# Patient Record
Sex: Female | Born: 1945 | Race: White | Hispanic: No | Marital: Married | Smoking: Former smoker
Health system: Southern US, Community
[De-identification: ages and names within clinical notes are randomized; demographics above are authoritative.]

## PROBLEM LIST (undated history)

## (undated) DIAGNOSIS — M810 Age-related osteoporosis without current pathological fracture: Secondary | ICD-10-CM

## (undated) DIAGNOSIS — I252 Old myocardial infarction: Secondary | ICD-10-CM

## (undated) DIAGNOSIS — I5030 Unspecified diastolic (congestive) heart failure: Secondary | ICD-10-CM

## (undated) DIAGNOSIS — I2510 Atherosclerotic heart disease of native coronary artery without angina pectoris: Secondary | ICD-10-CM

## (undated) DIAGNOSIS — E119 Type 2 diabetes mellitus without complications: Secondary | ICD-10-CM

## (undated) DIAGNOSIS — J449 Chronic obstructive pulmonary disease, unspecified: Secondary | ICD-10-CM

## (undated) DIAGNOSIS — E785 Hyperlipidemia, unspecified: Secondary | ICD-10-CM

## (undated) DIAGNOSIS — I10 Essential (primary) hypertension: Secondary | ICD-10-CM

## (undated) DIAGNOSIS — E870 Hyperosmolality and hypernatremia: Secondary | ICD-10-CM

## (undated) HISTORY — DX: Chronic obstructive pulmonary disease, unspecified: J44.9

## (undated) HISTORY — DX: Atherosclerotic heart disease of native coronary artery without angina pectoris: I25.10

## (undated) HISTORY — DX: Age-related osteoporosis without current pathological fracture: M81.0

## (undated) HISTORY — DX: Hyperlipidemia, unspecified: E78.5

## (undated) HISTORY — DX: Type 2 diabetes mellitus without complications: E11.9

## (undated) HISTORY — PX: TUBAL LIGATION: SHX77

## (undated) HISTORY — DX: Essential (primary) hypertension: I10

## (undated) HISTORY — PX: OTHER SURGICAL HISTORY: SHX169

## (undated) HISTORY — DX: Unspecified diastolic (congestive) heart failure: I50.30

## (undated) HISTORY — DX: Hyperosmolality and hypernatremia: E87.0

## (undated) HISTORY — PX: CATARACT EXTRACTION: SUR2

## (undated) HISTORY — DX: Old myocardial infarction: I25.2

---

## 2011-06-02 HISTORY — PX: CORONARY STENT PLACEMENT: SHX1402

## 2012-08-01 DIAGNOSIS — I10 Essential (primary) hypertension: Secondary | ICD-10-CM | POA: Diagnosis not present

## 2012-08-01 DIAGNOSIS — J441 Chronic obstructive pulmonary disease with (acute) exacerbation: Secondary | ICD-10-CM | POA: Diagnosis not present

## 2012-08-01 DIAGNOSIS — E1159 Type 2 diabetes mellitus with other circulatory complications: Secondary | ICD-10-CM | POA: Diagnosis not present

## 2012-08-01 DIAGNOSIS — Z9981 Dependence on supplemental oxygen: Secondary | ICD-10-CM | POA: Diagnosis not present

## 2012-08-01 DIAGNOSIS — F17200 Nicotine dependence, unspecified, uncomplicated: Secondary | ICD-10-CM | POA: Diagnosis not present

## 2012-08-04 DIAGNOSIS — I10 Essential (primary) hypertension: Secondary | ICD-10-CM | POA: Diagnosis not present

## 2012-08-04 DIAGNOSIS — Z9981 Dependence on supplemental oxygen: Secondary | ICD-10-CM | POA: Diagnosis not present

## 2012-08-04 DIAGNOSIS — F17200 Nicotine dependence, unspecified, uncomplicated: Secondary | ICD-10-CM | POA: Diagnosis not present

## 2012-08-04 DIAGNOSIS — J441 Chronic obstructive pulmonary disease with (acute) exacerbation: Secondary | ICD-10-CM | POA: Diagnosis not present

## 2012-08-04 DIAGNOSIS — E1159 Type 2 diabetes mellitus with other circulatory complications: Secondary | ICD-10-CM | POA: Diagnosis not present

## 2012-08-08 DIAGNOSIS — E782 Mixed hyperlipidemia: Secondary | ICD-10-CM | POA: Diagnosis not present

## 2012-08-08 DIAGNOSIS — E1159 Type 2 diabetes mellitus with other circulatory complications: Secondary | ICD-10-CM | POA: Diagnosis not present

## 2012-08-08 DIAGNOSIS — F17200 Nicotine dependence, unspecified, uncomplicated: Secondary | ICD-10-CM | POA: Diagnosis not present

## 2012-08-08 DIAGNOSIS — I10 Essential (primary) hypertension: Secondary | ICD-10-CM | POA: Diagnosis not present

## 2012-08-10 DIAGNOSIS — E1159 Type 2 diabetes mellitus with other circulatory complications: Secondary | ICD-10-CM | POA: Diagnosis not present

## 2012-08-10 DIAGNOSIS — F17200 Nicotine dependence, unspecified, uncomplicated: Secondary | ICD-10-CM | POA: Diagnosis not present

## 2012-08-10 DIAGNOSIS — I10 Essential (primary) hypertension: Secondary | ICD-10-CM | POA: Diagnosis not present

## 2012-08-10 DIAGNOSIS — Z9981 Dependence on supplemental oxygen: Secondary | ICD-10-CM | POA: Diagnosis not present

## 2012-08-10 DIAGNOSIS — J441 Chronic obstructive pulmonary disease with (acute) exacerbation: Secondary | ICD-10-CM | POA: Diagnosis not present

## 2012-08-12 DIAGNOSIS — E1159 Type 2 diabetes mellitus with other circulatory complications: Secondary | ICD-10-CM | POA: Diagnosis not present

## 2012-08-12 DIAGNOSIS — Z9981 Dependence on supplemental oxygen: Secondary | ICD-10-CM | POA: Diagnosis not present

## 2012-08-12 DIAGNOSIS — J441 Chronic obstructive pulmonary disease with (acute) exacerbation: Secondary | ICD-10-CM | POA: Diagnosis not present

## 2012-08-12 DIAGNOSIS — F17200 Nicotine dependence, unspecified, uncomplicated: Secondary | ICD-10-CM | POA: Diagnosis not present

## 2012-08-12 DIAGNOSIS — I10 Essential (primary) hypertension: Secondary | ICD-10-CM | POA: Diagnosis not present

## 2012-08-18 DIAGNOSIS — Z9981 Dependence on supplemental oxygen: Secondary | ICD-10-CM | POA: Diagnosis not present

## 2012-08-18 DIAGNOSIS — E1159 Type 2 diabetes mellitus with other circulatory complications: Secondary | ICD-10-CM | POA: Diagnosis not present

## 2012-08-18 DIAGNOSIS — I10 Essential (primary) hypertension: Secondary | ICD-10-CM | POA: Diagnosis not present

## 2012-08-18 DIAGNOSIS — J3089 Other allergic rhinitis: Secondary | ICD-10-CM | POA: Diagnosis not present

## 2012-08-18 DIAGNOSIS — J441 Chronic obstructive pulmonary disease with (acute) exacerbation: Secondary | ICD-10-CM | POA: Diagnosis not present

## 2012-08-18 DIAGNOSIS — J309 Allergic rhinitis, unspecified: Secondary | ICD-10-CM | POA: Diagnosis not present

## 2012-08-18 DIAGNOSIS — F17200 Nicotine dependence, unspecified, uncomplicated: Secondary | ICD-10-CM | POA: Diagnosis not present

## 2012-08-18 DIAGNOSIS — J449 Chronic obstructive pulmonary disease, unspecified: Secondary | ICD-10-CM | POA: Diagnosis not present

## 2012-08-23 DIAGNOSIS — J441 Chronic obstructive pulmonary disease with (acute) exacerbation: Secondary | ICD-10-CM | POA: Diagnosis not present

## 2012-08-23 DIAGNOSIS — F17200 Nicotine dependence, unspecified, uncomplicated: Secondary | ICD-10-CM | POA: Diagnosis not present

## 2012-08-23 DIAGNOSIS — I10 Essential (primary) hypertension: Secondary | ICD-10-CM | POA: Diagnosis not present

## 2012-08-23 DIAGNOSIS — Z9981 Dependence on supplemental oxygen: Secondary | ICD-10-CM | POA: Diagnosis not present

## 2012-08-23 DIAGNOSIS — E1159 Type 2 diabetes mellitus with other circulatory complications: Secondary | ICD-10-CM | POA: Diagnosis not present

## 2012-08-30 DIAGNOSIS — Z9981 Dependence on supplemental oxygen: Secondary | ICD-10-CM | POA: Diagnosis not present

## 2012-08-30 DIAGNOSIS — J441 Chronic obstructive pulmonary disease with (acute) exacerbation: Secondary | ICD-10-CM | POA: Diagnosis not present

## 2012-08-30 DIAGNOSIS — E1159 Type 2 diabetes mellitus with other circulatory complications: Secondary | ICD-10-CM | POA: Diagnosis not present

## 2012-08-30 DIAGNOSIS — F17200 Nicotine dependence, unspecified, uncomplicated: Secondary | ICD-10-CM | POA: Diagnosis not present

## 2012-08-30 DIAGNOSIS — I10 Essential (primary) hypertension: Secondary | ICD-10-CM | POA: Diagnosis not present

## 2012-09-02 DIAGNOSIS — H2589 Other age-related cataract: Secondary | ICD-10-CM | POA: Diagnosis not present

## 2012-09-06 DIAGNOSIS — I10 Essential (primary) hypertension: Secondary | ICD-10-CM | POA: Diagnosis not present

## 2012-09-06 DIAGNOSIS — I2510 Atherosclerotic heart disease of native coronary artery without angina pectoris: Secondary | ICD-10-CM | POA: Diagnosis not present

## 2012-09-06 DIAGNOSIS — F17200 Nicotine dependence, unspecified, uncomplicated: Secondary | ICD-10-CM | POA: Diagnosis not present

## 2012-09-06 DIAGNOSIS — E785 Hyperlipidemia, unspecified: Secondary | ICD-10-CM | POA: Diagnosis not present

## 2012-09-06 DIAGNOSIS — E119 Type 2 diabetes mellitus without complications: Secondary | ICD-10-CM | POA: Diagnosis not present

## 2012-09-14 DIAGNOSIS — F17200 Nicotine dependence, unspecified, uncomplicated: Secondary | ICD-10-CM | POA: Diagnosis not present

## 2012-09-14 DIAGNOSIS — E1159 Type 2 diabetes mellitus with other circulatory complications: Secondary | ICD-10-CM | POA: Diagnosis not present

## 2012-09-14 DIAGNOSIS — Z9981 Dependence on supplemental oxygen: Secondary | ICD-10-CM | POA: Diagnosis not present

## 2012-09-14 DIAGNOSIS — I10 Essential (primary) hypertension: Secondary | ICD-10-CM | POA: Diagnosis not present

## 2012-09-14 DIAGNOSIS — J441 Chronic obstructive pulmonary disease with (acute) exacerbation: Secondary | ICD-10-CM | POA: Diagnosis not present

## 2012-09-26 DIAGNOSIS — Z9981 Dependence on supplemental oxygen: Secondary | ICD-10-CM | POA: Diagnosis not present

## 2012-09-26 DIAGNOSIS — E1159 Type 2 diabetes mellitus with other circulatory complications: Secondary | ICD-10-CM | POA: Diagnosis not present

## 2012-09-26 DIAGNOSIS — F17200 Nicotine dependence, unspecified, uncomplicated: Secondary | ICD-10-CM | POA: Diagnosis not present

## 2012-09-26 DIAGNOSIS — I10 Essential (primary) hypertension: Secondary | ICD-10-CM | POA: Diagnosis not present

## 2012-09-26 DIAGNOSIS — J441 Chronic obstructive pulmonary disease with (acute) exacerbation: Secondary | ICD-10-CM | POA: Diagnosis not present

## 2012-09-27 DIAGNOSIS — H269 Unspecified cataract: Secondary | ICD-10-CM | POA: Diagnosis not present

## 2012-09-27 DIAGNOSIS — H2589 Other age-related cataract: Secondary | ICD-10-CM | POA: Diagnosis not present

## 2012-10-17 DIAGNOSIS — J441 Chronic obstructive pulmonary disease with (acute) exacerbation: Secondary | ICD-10-CM | POA: Diagnosis not present

## 2012-10-25 DIAGNOSIS — E119 Type 2 diabetes mellitus without complications: Secondary | ICD-10-CM | POA: Diagnosis not present

## 2012-10-25 DIAGNOSIS — H269 Unspecified cataract: Secondary | ICD-10-CM | POA: Diagnosis not present

## 2012-10-25 DIAGNOSIS — J449 Chronic obstructive pulmonary disease, unspecified: Secondary | ICD-10-CM | POA: Diagnosis not present

## 2012-10-25 DIAGNOSIS — H259 Unspecified age-related cataract: Secondary | ICD-10-CM | POA: Diagnosis not present

## 2012-10-25 DIAGNOSIS — H2589 Other age-related cataract: Secondary | ICD-10-CM | POA: Diagnosis not present

## 2012-11-02 DIAGNOSIS — I5032 Chronic diastolic (congestive) heart failure: Secondary | ICD-10-CM | POA: Diagnosis not present

## 2012-11-02 DIAGNOSIS — J441 Chronic obstructive pulmonary disease with (acute) exacerbation: Secondary | ICD-10-CM | POA: Diagnosis not present

## 2012-11-02 DIAGNOSIS — K219 Gastro-esophageal reflux disease without esophagitis: Secondary | ICD-10-CM | POA: Diagnosis not present

## 2012-11-17 DIAGNOSIS — J449 Chronic obstructive pulmonary disease, unspecified: Secondary | ICD-10-CM | POA: Diagnosis not present

## 2012-11-25 DIAGNOSIS — F17200 Nicotine dependence, unspecified, uncomplicated: Secondary | ICD-10-CM | POA: Diagnosis not present

## 2012-11-25 DIAGNOSIS — J3089 Other allergic rhinitis: Secondary | ICD-10-CM | POA: Diagnosis not present

## 2012-11-25 DIAGNOSIS — Z006 Encounter for examination for normal comparison and control in clinical research program: Secondary | ICD-10-CM | POA: Diagnosis not present

## 2012-11-25 DIAGNOSIS — J4489 Other specified chronic obstructive pulmonary disease: Secondary | ICD-10-CM | POA: Diagnosis not present

## 2012-11-25 DIAGNOSIS — J449 Chronic obstructive pulmonary disease, unspecified: Secondary | ICD-10-CM | POA: Diagnosis not present

## 2012-12-01 DIAGNOSIS — E782 Mixed hyperlipidemia: Secondary | ICD-10-CM | POA: Diagnosis not present

## 2012-12-01 DIAGNOSIS — I10 Essential (primary) hypertension: Secondary | ICD-10-CM | POA: Diagnosis not present

## 2012-12-01 DIAGNOSIS — E1159 Type 2 diabetes mellitus with other circulatory complications: Secondary | ICD-10-CM | POA: Diagnosis not present

## 2012-12-07 DIAGNOSIS — J449 Chronic obstructive pulmonary disease, unspecified: Secondary | ICD-10-CM | POA: Diagnosis not present

## 2012-12-12 DIAGNOSIS — E782 Mixed hyperlipidemia: Secondary | ICD-10-CM | POA: Diagnosis not present

## 2012-12-12 DIAGNOSIS — E1149 Type 2 diabetes mellitus with other diabetic neurological complication: Secondary | ICD-10-CM | POA: Diagnosis not present

## 2012-12-12 DIAGNOSIS — I10 Essential (primary) hypertension: Secondary | ICD-10-CM | POA: Diagnosis not present

## 2012-12-12 DIAGNOSIS — K219 Gastro-esophageal reflux disease without esophagitis: Secondary | ICD-10-CM | POA: Diagnosis not present

## 2012-12-27 DIAGNOSIS — J3089 Other allergic rhinitis: Secondary | ICD-10-CM | POA: Diagnosis not present

## 2012-12-27 DIAGNOSIS — J449 Chronic obstructive pulmonary disease, unspecified: Secondary | ICD-10-CM | POA: Diagnosis not present

## 2013-02-10 DIAGNOSIS — J3089 Other allergic rhinitis: Secondary | ICD-10-CM | POA: Diagnosis not present

## 2013-02-10 DIAGNOSIS — J449 Chronic obstructive pulmonary disease, unspecified: Secondary | ICD-10-CM | POA: Diagnosis not present

## 2013-03-09 DIAGNOSIS — I10 Essential (primary) hypertension: Secondary | ICD-10-CM | POA: Diagnosis not present

## 2013-03-09 DIAGNOSIS — E1159 Type 2 diabetes mellitus with other circulatory complications: Secondary | ICD-10-CM | POA: Diagnosis not present

## 2013-03-09 DIAGNOSIS — E782 Mixed hyperlipidemia: Secondary | ICD-10-CM | POA: Diagnosis not present

## 2013-03-13 DIAGNOSIS — E870 Hyperosmolality and hypernatremia: Secondary | ICD-10-CM | POA: Diagnosis not present

## 2013-03-15 DIAGNOSIS — IMO0002 Reserved for concepts with insufficient information to code with codable children: Secondary | ICD-10-CM | POA: Diagnosis not present

## 2013-03-15 DIAGNOSIS — Z139 Encounter for screening, unspecified: Secondary | ICD-10-CM | POA: Diagnosis not present

## 2013-03-15 DIAGNOSIS — E870 Hyperosmolality and hypernatremia: Secondary | ICD-10-CM | POA: Diagnosis not present

## 2013-03-15 DIAGNOSIS — Z1389 Encounter for screening for other disorder: Secondary | ICD-10-CM | POA: Diagnosis not present

## 2013-03-15 DIAGNOSIS — Z0000 Encounter for general adult medical examination without abnormal findings: Secondary | ICD-10-CM | POA: Diagnosis not present

## 2013-03-15 DIAGNOSIS — Z1331 Encounter for screening for depression: Secondary | ICD-10-CM | POA: Diagnosis not present

## 2013-03-15 DIAGNOSIS — Z23 Encounter for immunization: Secondary | ICD-10-CM | POA: Diagnosis not present

## 2013-03-28 DIAGNOSIS — E119 Type 2 diabetes mellitus without complications: Secondary | ICD-10-CM | POA: Diagnosis not present

## 2013-03-28 DIAGNOSIS — Z79899 Other long term (current) drug therapy: Secondary | ICD-10-CM | POA: Diagnosis not present

## 2013-03-28 DIAGNOSIS — I2510 Atherosclerotic heart disease of native coronary artery without angina pectoris: Secondary | ICD-10-CM | POA: Diagnosis not present

## 2013-03-28 DIAGNOSIS — E1159 Type 2 diabetes mellitus with other circulatory complications: Secondary | ICD-10-CM | POA: Diagnosis not present

## 2013-03-28 DIAGNOSIS — I10 Essential (primary) hypertension: Secondary | ICD-10-CM | POA: Diagnosis not present

## 2013-03-28 DIAGNOSIS — E785 Hyperlipidemia, unspecified: Secondary | ICD-10-CM | POA: Diagnosis not present

## 2013-05-02 DIAGNOSIS — J449 Chronic obstructive pulmonary disease, unspecified: Secondary | ICD-10-CM | POA: Diagnosis not present

## 2013-05-02 DIAGNOSIS — J3089 Other allergic rhinitis: Secondary | ICD-10-CM | POA: Diagnosis not present

## 2013-05-02 LAB — PULMONARY FUNCTION TEST

## 2013-05-24 DIAGNOSIS — R509 Fever, unspecified: Secondary | ICD-10-CM | POA: Diagnosis not present

## 2013-05-24 DIAGNOSIS — IMO0002 Reserved for concepts with insufficient information to code with codable children: Secondary | ICD-10-CM | POA: Diagnosis not present

## 2013-05-24 DIAGNOSIS — J441 Chronic obstructive pulmonary disease with (acute) exacerbation: Secondary | ICD-10-CM | POA: Diagnosis not present

## 2013-06-02 DIAGNOSIS — J441 Chronic obstructive pulmonary disease with (acute) exacerbation: Secondary | ICD-10-CM | POA: Diagnosis not present

## 2013-06-02 DIAGNOSIS — IMO0002 Reserved for concepts with insufficient information to code with codable children: Secondary | ICD-10-CM | POA: Diagnosis not present

## 2013-06-20 DIAGNOSIS — E1159 Type 2 diabetes mellitus with other circulatory complications: Secondary | ICD-10-CM | POA: Diagnosis not present

## 2013-06-20 DIAGNOSIS — I10 Essential (primary) hypertension: Secondary | ICD-10-CM | POA: Diagnosis not present

## 2013-06-20 DIAGNOSIS — E782 Mixed hyperlipidemia: Secondary | ICD-10-CM | POA: Diagnosis not present

## 2013-06-26 DIAGNOSIS — E1159 Type 2 diabetes mellitus with other circulatory complications: Secondary | ICD-10-CM | POA: Diagnosis not present

## 2013-06-26 DIAGNOSIS — J449 Chronic obstructive pulmonary disease, unspecified: Secondary | ICD-10-CM | POA: Diagnosis not present

## 2013-06-26 DIAGNOSIS — IMO0002 Reserved for concepts with insufficient information to code with codable children: Secondary | ICD-10-CM | POA: Diagnosis not present

## 2013-06-26 DIAGNOSIS — I119 Hypertensive heart disease without heart failure: Secondary | ICD-10-CM | POA: Diagnosis not present

## 2013-06-26 DIAGNOSIS — I5032 Chronic diastolic (congestive) heart failure: Secondary | ICD-10-CM | POA: Diagnosis not present

## 2013-06-26 DIAGNOSIS — Z1231 Encounter for screening mammogram for malignant neoplasm of breast: Secondary | ICD-10-CM | POA: Diagnosis not present

## 2013-06-26 DIAGNOSIS — E782 Mixed hyperlipidemia: Secondary | ICD-10-CM | POA: Diagnosis not present

## 2013-06-30 DIAGNOSIS — Z1231 Encounter for screening mammogram for malignant neoplasm of breast: Secondary | ICD-10-CM | POA: Diagnosis not present

## 2013-07-04 DIAGNOSIS — I119 Hypertensive heart disease without heart failure: Secondary | ICD-10-CM | POA: Diagnosis not present

## 2013-07-04 DIAGNOSIS — E782 Mixed hyperlipidemia: Secondary | ICD-10-CM | POA: Diagnosis not present

## 2013-07-04 DIAGNOSIS — Z5181 Encounter for therapeutic drug level monitoring: Secondary | ICD-10-CM | POA: Diagnosis not present

## 2013-07-04 DIAGNOSIS — I5032 Chronic diastolic (congestive) heart failure: Secondary | ICD-10-CM | POA: Diagnosis not present

## 2013-07-06 ENCOUNTER — Telehealth: Payer: Self-pay | Admitting: Critical Care Medicine

## 2013-07-06 NOTE — Telephone Encounter (Signed)
Rec'd fax to schedule a consult appt in [place].  Pt is scheduled to see PW in [place] on 2/24 @ 4:15 PM.  Dr. Yetta FlockHodges' office verbalized understanding & states nothing further needed at this time.  Antionette FairyHolly D Pryor

## 2013-08-28 ENCOUNTER — Encounter: Payer: Self-pay | Admitting: Critical Care Medicine

## 2013-08-28 ENCOUNTER — Telehealth: Payer: Self-pay | Admitting: Critical Care Medicine

## 2013-08-28 NOTE — Telephone Encounter (Signed)
Error -- Needed abstract encounter

## 2013-08-29 ENCOUNTER — Ambulatory Visit (INDEPENDENT_AMBULATORY_CARE_PROVIDER_SITE_OTHER): Payer: Self-pay | Admitting: Critical Care Medicine

## 2013-08-29 ENCOUNTER — Encounter: Payer: Self-pay | Admitting: Critical Care Medicine

## 2013-08-29 VITALS — BP 132/54 | HR 88

## 2013-08-29 DIAGNOSIS — J40 Bronchitis, not specified as acute or chronic: Secondary | ICD-10-CM | POA: Diagnosis not present

## 2013-08-29 DIAGNOSIS — R918 Other nonspecific abnormal finding of lung field: Secondary | ICD-10-CM | POA: Diagnosis not present

## 2013-08-29 DIAGNOSIS — J449 Chronic obstructive pulmonary disease, unspecified: Secondary | ICD-10-CM

## 2013-08-29 DIAGNOSIS — E785 Hyperlipidemia, unspecified: Secondary | ICD-10-CM | POA: Insufficient documentation

## 2013-08-29 DIAGNOSIS — I5030 Unspecified diastolic (congestive) heart failure: Secondary | ICD-10-CM | POA: Insufficient documentation

## 2013-08-29 DIAGNOSIS — I10 Essential (primary) hypertension: Secondary | ICD-10-CM | POA: Insufficient documentation

## 2013-08-29 DIAGNOSIS — I2510 Atherosclerotic heart disease of native coronary artery without angina pectoris: Secondary | ICD-10-CM | POA: Insufficient documentation

## 2013-08-29 DIAGNOSIS — E119 Type 2 diabetes mellitus without complications: Secondary | ICD-10-CM | POA: Insufficient documentation

## 2013-08-29 DIAGNOSIS — M810 Age-related osteoporosis without current pathological fracture: Secondary | ICD-10-CM | POA: Insufficient documentation

## 2013-08-29 MED ORDER — AZITHROMYCIN 250 MG PO TABS: ORAL_TABLET | ORAL | Status: DC

## 2013-08-29 MED ORDER — BUDESONIDE 0.25 MG/2ML IN SUSP: 0.2500 mg | Freq: Every day | RESPIRATORY_TRACT | Status: DC

## 2013-08-29 MED ORDER — LOSARTAN POTASSIUM 100 MG PO TABS: 100.0000 mg | ORAL_TABLET | Freq: Every day | ORAL | Status: DC

## 2013-08-29 MED ORDER — LOSARTAN POTASSIUM 100 MG PO TABS: 50.0000 mg | ORAL_TABLET | Freq: Every day | ORAL | Status: DC

## 2013-08-29 MED ORDER — PREDNISONE 10 MG PO TABS: ORAL_TABLET | ORAL | Status: DC

## 2013-08-29 NOTE — Progress Notes (Signed)
Subjective:    Patient ID: Samantha Wong, female    DOB: 1945/11/25, 68 y.o.   MRN: [id number]  HPI Comments: Referral Copd. Sats low 80s on arrival on oxygen. Patient presents with:   Pulmonary Consult - referred by Dr. Yetta Flock for COPD.  O2 sat 84% on 2lpm pulsed upon arrival to exam room. Dx 2009 Copd, on oxygen since 2011.  Normally 3L at home 2L exertion?     Shortness of Breath This is a chronic problem. The current episode started more than 1 year ago. The problem occurs daily (exertional only, not able to do housework). The problem has been waxing and waning (recentlly more congested and green mucus). Associated symptoms include leg swelling and sputum production. Pertinent negatives include no abdominal pain, chest pain, coryza, ear pain, fever, headaches, hemoptysis, leg pain, neck pain, rash, rhinorrhea, sore throat, swollen glands, syncope, vomiting or wheezing. The symptoms are aggravated by any activity, exercise, fumes, odors, lying flat, smoke, weather changes and URIs. Associated symptoms comments: Mucus recently changed to green. Risk factors include smoking (quit smoking 13days ago!). She has tried beta agonist inhalers for the symptoms. The treatment provided significant relief. Her past medical history is significant for CAD, COPD and pneumonia. There is no history of allergies, aspirin allergies, asthma, bronchiolitis, chronic lung disease, DVT, a heart failure, PE or a recent surgery.    Past Medical History  Diagnosis Date  . Type II diabetes mellitus   . Hyperlipemia   . Hypertension   . COPD (chronic obstructive pulmonary disease)   . Osteoporosis   . Diastolic heart failure   . Coronary atherosclerosis   . Hypernatremia   . History of heart attack      Family History  Problem Relation Age of Onset  . CAD Mother   . Diabetes Mother   . Hypothyroidism Mother   . COPD Father   . Emphysema Brother      History   Social History  . Marital Status: N/A   Spouse Name: N/A    Number of Children: N/A  . Years of Education: N/A   Occupational History  . Secretary    Social History Main Topics  . Smoking status: Former Smoker -- 1.50 packs/day for 40 years    Types: Cigarettes    Quit date: 08/16/2013  . Smokeless tobacco: Never Used  . Alcohol Use: No  . Drug Use: No  . Sexual Activity: Not on file   Other Topics Concern  . Not on file   Social History Narrative  . No narrative on file     Allergies  Allergen Reactions  . Codeine     vomiting     Outpatient Prescriptions Prior to Visit  Medication Sig Dispense Refill  . albuterol (PROVENTIL HFA;VENTOLIN HFA) 108 (90 BASE) MCG/ACT inhaler Inhale into the lungs every 4 (four) hours as needed for wheezing or shortness of breath.      Marland Kitchen albuterol (PROVENTIL) (2.5 MG/3ML) 0.083% nebulizer solution Take 2.5 mg by nebulization 4 (four) times daily as needed for wheezing or shortness of breath.       Marland Kitchen arformoterol (BROVANA) 15 MCG/2ML NEBU Take 15 mcg by nebulization 2 (two) times daily.      Marland Kitchen aspirin 81 MG tablet Take 81 mg by mouth daily.      Marland Kitchen atorvastatin (LIPITOR) 20 MG tablet Take 20 mg by mouth daily.      . digoxin (LANOXIN) 0.25 MG tablet Take 0.25 mg by mouth  every other day.      . esomeprazole (NEXIUM) 40 MG packet Take 40 mg by mouth daily before breakfast.      . furosemide (LASIX) 40 MG tablet Take 20 mg by mouth.      Marland Kitchen glimepiride (AMARYL) 1 MG tablet Take 1 mg by mouth daily with breakfast.       . loratadine (CLARITIN) 10 MG tablet Take 10 mg by mouth daily as needed.       . metoprolol succinate (TOPROL-XL) 25 MG 24 hr tablet Take 25 mg by mouth daily.      . mometasone (NASONEX) 50 MCG/ACT nasal spray Place 2 sprays into the nose daily as needed.       . nitroGLYCERIN (NITROSTAT) 0.4 MG SL tablet Place 0.4 mg under the tongue every 5 (five) minutes as needed for chest pain.      . prasugrel (EFFIENT) 10 MG TABS tablet Take 10 mg by mouth daily.      .  roflumilast (DALIRESP) 500 MCG TABS tablet Take 500 mcg by mouth daily.      . SitaGLIPtin-MetFORMIN HCl (JANUMET XR) 50-500 MG TB24 Take by mouth daily.      Marland Kitchen tiotropium (SPIRIVA) 18 MCG inhalation capsule Place 18 mcg into inhaler and inhale daily.      Marland Kitchen lisinopril (PRINIVIL,ZESTRIL) 40 MG tablet Take 20 mg by mouth daily.       Marland Kitchen losartan (COZAAR) 50 MG tablet Take 50 mg by mouth daily.      . metFORMIN (GLUCOPHAGE-XR) 500 MG 24 hr tablet Take 500 mg by mouth daily with breakfast.       No facility-administered medications prior to visit.      Review of Systems  Constitutional: Negative for fever, chills, diaphoresis, activity change, appetite change, fatigue and unexpected weight change.  HENT: Positive for congestion and sneezing. Negative for dental problem, ear discharge, ear pain, facial swelling, hearing loss, mouth sores, nosebleeds, postnasal drip, rhinorrhea, sinus pressure, sore throat, tinnitus, trouble swallowing and voice change.   Eyes: Positive for itching. Negative for photophobia, discharge and visual disturbance.  Respiratory: Positive for cough, sputum production and shortness of breath. Negative for apnea, hemoptysis, choking, chest tightness, wheezing and stridor.   Cardiovascular: Positive for palpitations and leg swelling. Negative for chest pain and syncope.  Gastrointestinal: Negative for nausea, vomiting, abdominal pain, constipation, blood in stool and abdominal distention.  Genitourinary: Negative for dysuria, urgency, frequency, hematuria, flank pain, decreased urine volume and difficulty urinating.  Musculoskeletal: Negative for arthralgias, back pain, gait problem, joint swelling, myalgias, neck pain and neck stiffness.  Skin: Negative for color change, pallor and rash.  Neurological: Negative for dizziness, tremors, seizures, syncope, speech difficulty, weakness, light-headedness, numbness and headaches.  Hematological: Negative for adenopathy.  Bruises/bleeds easily.  Psychiatric/Behavioral: Positive for sleep disturbance. Negative for confusion and agitation. The patient is nervous/anxious.        Objective:   Physical Exam Filed Vitals:   08/29/13 1425 08/29/13 1438  BP:  132/54  Pulse:  88  SpO2: 84% 91%    Gen: Pleasant, well-nourished, in no distress,  normal affect  ENT: No lesions,  mouth clear,  oropharynx clear, no postnasal drip  Neck: No JVD, no TMG, no carotid bruits  Lungs: No use of accessory muscles, no dullness to percussion, exp wheeze  Cardiovascular: RRR, heart sounds normal, no murmur or gallops, no peripheral edema  Abdomen: soft and NT, no HSM,  BS normal  Musculoskeletal: No deformities, no cyanosis  or clubbing  Neuro: alert, non focal  Skin: Warm, no lesions or rashes  No results found.  All outside records/films reviewed       Assessment & Plan:   COPD (chronic obstructive pulmonary disease) Copd oxygen dependent. Recent PFTs available from other Pulm MD office Pt on multiple Copd meds that conflict Current acute exacerbation Plan Stop Advair Reduce Budesonide to one daily in nebulizer Stay on Brovana twice daily Stop lisinopril Increase losartan to 100mg  daily Wear oxygen 2Liter rest 3Liter exertion Use melatonin 3mg  at bedtime to sleep Use albuterol as needed Stay on spiriva Azithromycin 250mg  Take two once then one daily until gone Prednisone 10mg  Take 4 tablets daily for 5 days then stop Chest xray today Records from [facility] Return 2 months    Updated Medication List Outpatient Encounter Prescriptions as of 08/29/2013  Medication Sig  . albuterol (PROVENTIL HFA;VENTOLIN HFA) 108 (90 BASE) MCG/ACT inhaler Inhale into the lungs every 4 (four) hours as needed for wheezing or shortness of breath.  Marland Kitchen albuterol (PROVENTIL) (2.5 MG/3ML) 0.083% nebulizer solution Take 2.5 mg by nebulization 4 (four) times daily as needed for wheezing or shortness of breath.   Marland Kitchen  arformoterol (BROVANA) 15 MCG/2ML NEBU Take 15 mcg by nebulization 2 (two) times daily.  Marland Kitchen aspirin 81 MG tablet Take 81 mg by mouth daily.  Marland Kitchen atorvastatin (LIPITOR) 20 MG tablet Take 20 mg by mouth daily.  . budesonide (PULMICORT) 0.25 MG/2ML nebulizer solution Take 2 mLs (0.25 mg total) by nebulization daily.  . Calcium Carbonate-Vitamin D (CALCIUM-VITAMIN D) 600-125 MG-UNIT TABS Take by mouth daily.  . Cholecalciferol 1000 UNITS capsule Take 1,000 Units by mouth daily.  . digoxin (LANOXIN) 0.25 MG tablet Take 0.25 mg by mouth every other day.  . esomeprazole (NEXIUM) 40 MG packet Take 40 mg by mouth daily before breakfast.  . ferrous sulfate 325 (65 FE) MG tablet Take 325 mg by mouth daily with breakfast.  . furosemide (LASIX) 40 MG tablet Take 20 mg by mouth.  Marland Kitchen glimepiride (AMARYL) 1 MG tablet Take 1 mg by mouth daily with breakfast.   . loratadine (CLARITIN) 10 MG tablet Take 10 mg by mouth daily as needed.   Marland Kitchen losartan (COZAAR) 100 MG tablet Take 1 tablet (100 mg total) by mouth daily.  . metoprolol succinate (TOPROL-XL) 25 MG 24 hr tablet Take 25 mg by mouth daily.  . mometasone (NASONEX) 50 MCG/ACT nasal spray Place 2 sprays into the nose daily as needed.   . Multiple Vitamin (MULTIVITAMIN) tablet Take 1 tablet by mouth daily.  . nitroGLYCERIN (NITROSTAT) 0.4 MG SL tablet Place 0.4 mg under the tongue every 5 (five) minutes as needed for chest pain.  . prasugrel (EFFIENT) 10 MG TABS tablet Take 10 mg by mouth daily.  . roflumilast (DALIRESP) 500 MCG TABS tablet Take 500 mcg by mouth daily.  . SitaGLIPtin-MetFORMIN HCl (JANUMET XR) 50-500 MG TB24 Take by mouth daily.  Marland Kitchen tiotropium (SPIRIVA) 18 MCG inhalation capsule Place 18 mcg into inhaler and inhale daily.  . [DISCONTINUED] budesonide (PULMICORT) 0.25 MG/2ML nebulizer solution Take 0.25 mg by nebulization. 3-4 times daily - unsure of strength  . [DISCONTINUED] budesonide (PULMICORT) 0.25 MG/2ML nebulizer solution Take 2 mLs (0.25 mg  total) by nebulization daily. 3-4 times daily - unsure of strength  . [DISCONTINUED] Fluticasone-Salmeterol (ADVAIR) 250-50 MCG/DOSE AEPB Inhale 1 puff into the lungs 2 (two) times daily.  . [DISCONTINUED] lisinopril (PRINIVIL,ZESTRIL) 40 MG tablet Take 20 mg by mouth daily.   . [  DISCONTINUED] losartan (COZAAR) 100 MG tablet Take 0.5 tablets (50 mg total) by mouth daily.  . [DISCONTINUED] losartan (COZAAR) 50 MG tablet Take 50 mg by mouth daily.  Marland Kitchen. azithromycin (ZITHROMAX) 250 MG tablet Take two once then one daily until gone  . predniSONE (DELTASONE) 10 MG tablet Take 4 tablets daily for 5 days then stop  . [DISCONTINUED] metFORMIN (GLUCOPHAGE-XR) 500 MG 24 hr tablet Take 500 mg by mouth daily with breakfast.

## 2013-08-29 NOTE — Patient Instructions (Signed)
Stop Advair Reduce Budesonide to one daily in nebulizer Stay on Brovana twice daily Stop lisinopril Increase losartan to 100mg  daily Wear oxygen 2Liter rest 3Liter exertion Use melatonin 3mg  at bedtime to sleep Use albuterol as needed Stay on spiriva Azithromycin 250mg  Take two once then one daily until gone Prednisone 10mg  Take 4 tablets daily for 5 days then stop Chest xray today Records from [facility] Return 2 months

## 2013-08-31 ENCOUNTER — Telehealth: Payer: Self-pay | Admitting: Critical Care Medicine

## 2013-08-31 NOTE — Assessment & Plan Note (Signed)
Copd oxygen dependent. Recent PFTs available from other Pulm MD office Pt on multiple Copd meds that conflict Current acute exacerbation Plan Stop Advair Reduce Budesonide to one daily in nebulizer Stay on Brovana twice daily Stop lisinopril Increase losartan to 100mg  daily Wear oxygen 2Liter rest 3Liter exertion Use melatonin 3mg  at bedtime to sleep Use albuterol as needed Stay on spiriva Azithromycin 250mg  Take two once then one daily until gone Prednisone 10mg  Take 4 tablets daily for 5 days then stop Chest xray today Records from [facility] Return 2 months

## 2013-08-31 NOTE — Telephone Encounter (Signed)
Rec'd from [facility] forward 25 pages to Dr.Wright

## 2013-09-05 ENCOUNTER — Encounter: Payer: Self-pay | Admitting: Critical Care Medicine

## 2013-09-07 ENCOUNTER — Telehealth: Payer: Self-pay | Admitting: Critical Care Medicine

## 2013-09-07 NOTE — Telephone Encounter (Signed)
Received cxr results from [facility] done on 08/29/13 - cxr was ordered by Dr. Delford FieldWright. Per Dr. Delford FieldWright, call pt: tell her cxr is stable; copd changes only.  Called, spoke with pt.  Informed her of cxr results per Dr. Delford FieldWright.  She verbalized understanding and voiced no further questions or concerns at this time.  Results placed in scan folder.

## 2013-09-29 DIAGNOSIS — I2510 Atherosclerotic heart disease of native coronary artery without angina pectoris: Secondary | ICD-10-CM | POA: Diagnosis not present

## 2013-09-29 DIAGNOSIS — E785 Hyperlipidemia, unspecified: Secondary | ICD-10-CM | POA: Diagnosis not present

## 2013-09-29 DIAGNOSIS — Z79899 Other long term (current) drug therapy: Secondary | ICD-10-CM | POA: Diagnosis not present

## 2013-09-29 DIAGNOSIS — I10 Essential (primary) hypertension: Secondary | ICD-10-CM | POA: Diagnosis not present

## 2013-09-29 DIAGNOSIS — E119 Type 2 diabetes mellitus without complications: Secondary | ICD-10-CM | POA: Diagnosis not present

## 2013-09-29 DIAGNOSIS — J449 Chronic obstructive pulmonary disease, unspecified: Secondary | ICD-10-CM | POA: Diagnosis not present

## 2013-10-02 DIAGNOSIS — E782 Mixed hyperlipidemia: Secondary | ICD-10-CM | POA: Diagnosis not present

## 2013-10-02 DIAGNOSIS — I119 Hypertensive heart disease without heart failure: Secondary | ICD-10-CM | POA: Diagnosis not present

## 2013-10-02 DIAGNOSIS — E1159 Type 2 diabetes mellitus with other circulatory complications: Secondary | ICD-10-CM | POA: Diagnosis not present

## 2013-10-09 DIAGNOSIS — E1159 Type 2 diabetes mellitus with other circulatory complications: Secondary | ICD-10-CM | POA: Diagnosis not present

## 2013-10-09 DIAGNOSIS — J449 Chronic obstructive pulmonary disease, unspecified: Secondary | ICD-10-CM | POA: Diagnosis not present

## 2013-10-09 DIAGNOSIS — I2510 Atherosclerotic heart disease of native coronary artery without angina pectoris: Secondary | ICD-10-CM | POA: Diagnosis not present

## 2013-10-09 DIAGNOSIS — I119 Hypertensive heart disease without heart failure: Secondary | ICD-10-CM | POA: Diagnosis not present

## 2013-10-09 DIAGNOSIS — I798 Other disorders of arteries, arterioles and capillaries in diseases classified elsewhere: Secondary | ICD-10-CM | POA: Diagnosis not present

## 2013-10-09 DIAGNOSIS — Z79899 Other long term (current) drug therapy: Secondary | ICD-10-CM | POA: Diagnosis not present

## 2013-10-18 DIAGNOSIS — IMO0002 Reserved for concepts with insufficient information to code with codable children: Secondary | ICD-10-CM | POA: Diagnosis not present

## 2013-10-18 DIAGNOSIS — J449 Chronic obstructive pulmonary disease, unspecified: Secondary | ICD-10-CM | POA: Diagnosis not present

## 2013-10-24 ENCOUNTER — Encounter: Payer: Self-pay | Admitting: Critical Care Medicine

## 2013-10-24 ENCOUNTER — Ambulatory Visit (INDEPENDENT_AMBULATORY_CARE_PROVIDER_SITE_OTHER): Payer: Self-pay | Admitting: Critical Care Medicine

## 2013-10-24 VITALS — BP 124/56 | HR 87 | Temp 99.0°F | Ht 63.5 in | Wt 122.4 lb

## 2013-10-24 DIAGNOSIS — M949 Disorder of cartilage, unspecified: Secondary | ICD-10-CM | POA: Diagnosis not present

## 2013-10-24 DIAGNOSIS — M899 Disorder of bone, unspecified: Secondary | ICD-10-CM | POA: Diagnosis not present

## 2013-10-24 DIAGNOSIS — F17200 Nicotine dependence, unspecified, uncomplicated: Secondary | ICD-10-CM | POA: Diagnosis not present

## 2013-10-24 DIAGNOSIS — J449 Chronic obstructive pulmonary disease, unspecified: Secondary | ICD-10-CM | POA: Diagnosis not present

## 2013-10-24 DIAGNOSIS — J9611 Chronic respiratory failure with hypoxia: Secondary | ICD-10-CM

## 2013-10-24 NOTE — Progress Notes (Signed)
Subjective:    Patient ID: Samantha Wong, female    DOB: 08/24/1945, 68 y.o.   MRN: [id number]  HPI Comments: Dx 2009 Copd, on oxygen since 2011.  Normally 3L at home 2L exertion.  GOLD D Copd FeV1 23%  DLCO 8% predicted    10/24/2013 Chief Complaint  Patient presents with  . 2 month follow up    o2 sat 86% on 2lpulsed upon arrival to exam rom. Was seen by PCP office x 1 wk ago d/t SOB.  Was given prednisone and Azithromycin.  Finished abx yesterday and pred today.  SOB is improving.  Not coughing much with white mucus.  No wheezing and chest tightness/pain..  Pt had recent exac and rx pred/azithromycin per pcp. Now is better. Min mucus now.  No real chest pain.  No wheezes. Pt denies any significant sore throat, nasal congestion or excess secretions, fever, chills, sweats, unintended weight loss, pleurtic or exertional chest pain, orthopnea PND, or leg swelling Pt denies any increase in rescue therapy over baseline, denies waking up needing it or having any early am or nocturnal exacerbations of coughing/wheezing/or dyspnea. Pt also denies any obvious fluctuation in symptoms with  weather or environmental change or other alleviating or aggravating factors  Review of Systems  Constitutional: Negative for chills, diaphoresis, activity change, appetite change, fatigue and unexpected weight change.  HENT: Positive for congestion and sneezing. Negative for dental problem, ear discharge, facial swelling, hearing loss, mouth sores, nosebleeds, postnasal drip, sinus pressure, tinnitus, trouble swallowing and voice change.   Eyes: Positive for itching. Negative for photophobia, discharge and visual disturbance.  Respiratory: Positive for cough. Negative for apnea, choking, chest tightness and stridor.   Cardiovascular: Positive for palpitations.  Gastrointestinal: Negative for nausea, constipation, blood in stool and abdominal distention.  Genitourinary: Negative for dysuria, urgency, frequency,  hematuria, flank pain, decreased urine volume and difficulty urinating.  Musculoskeletal: Negative for arthralgias, back pain, gait problem, joint swelling, myalgias and neck stiffness.  Skin: Negative for color change and pallor.  Neurological: Negative for dizziness, tremors, seizures, syncope, speech difficulty, weakness, light-headedness and numbness.  Hematological: Negative for adenopathy. Bruises/bleeds easily.  Psychiatric/Behavioral: Positive for sleep disturbance. Negative for confusion and agitation. The patient is nervous/anxious.        Objective:   Physical Exam  Filed Vitals:   10/24/13 1112 10/24/13 1115  BP:  124/56  Pulse:  87  Temp:  99 F (37.2 C)  TempSrc:  Oral  Height:  5' 3.5" (1.613 m)  Weight:  122 lb 6.4 oz (55.52 kg)  SpO2: 86% 91%    Gen: Pleasant, well-nourished, in no distress,  normal affect  ENT: No lesions,  mouth clear,  oropharynx clear, no postnasal drip  Neck: No JVD, no TMG, no carotid bruits  Lungs: No use of accessory muscles, no dullness to percussion, no wheeze  Cardiovascular: RRR, heart sounds normal, no murmur or gallops, no peripheral edema  Abdomen: soft and NT, no HSM,  BS normal  Musculoskeletal: No deformities, no cyanosis or clubbing  Neuro: alert, non focal  Skin: Warm, no lesions or rashes  No results found.  CXR 08/29/13: Scar at bases, copd changes      Assessment & Plan:   COPD (chronic obstructive pulmonary disease)Gold D Gold D copd stable at present Needs to quit smoking No other medication changes Use 3Liters with exertion Return 3 months   Tobacco use disorder Ongoing tobacco use Plan Tobacco counseling issued    Updated Medication  List Outpatient Encounter Prescriptions as of 10/24/2013  Medication Sig  . albuterol (PROVENTIL HFA;VENTOLIN HFA) 108 (90 BASE) MCG/ACT inhaler Inhale into the lungs every 4 (four) hours as needed for wheezing or shortness of breath.  Marland Kitchen albuterol (PROVENTIL)  (2.5 MG/3ML) 0.083% nebulizer solution Take 2.5 mg by nebulization 4 (four) times daily as needed for wheezing or shortness of breath.   Marland Kitchen arformoterol (BROVANA) 15 MCG/2ML NEBU Take 15 mcg by nebulization 2 (two) times daily.  Marland Kitchen aspirin 81 MG tablet Take 81 mg by mouth daily.  Marland Kitchen atorvastatin (LIPITOR) 20 MG tablet Take 20 mg by mouth daily.  . budesonide (PULMICORT) 0.25 MG/2ML nebulizer solution Take 2 mLs (0.25 mg total) by nebulization daily.  . Calcium Carbonate-Vitamin D (CALCIUM-VITAMIN D) 600-125 MG-UNIT TABS Take by mouth daily.  . Cholecalciferol 1000 UNITS capsule Take 1,000 Units by mouth daily.  . digoxin (LANOXIN) 0.25 MG tablet Take 0.25 mg by mouth every other day.  . esomeprazole (NEXIUM) 40 MG packet Take 40 mg by mouth daily before breakfast.  . ferrous sulfate 325 (65 FE) MG tablet Take 325 mg by mouth daily with breakfast.  . furosemide (LASIX) 40 MG tablet Take 20 mg by mouth.  Marland Kitchen glimepiride (AMARYL) 1 MG tablet Take 1 mg by mouth daily with breakfast.   . loratadine (CLARITIN) 10 MG tablet Take 10 mg by mouth daily as needed.   Marland Kitchen losartan (COZAAR) 100 MG tablet Take 1 tablet (100 mg total) by mouth daily.  . metoprolol succinate (TOPROL-XL) 25 MG 24 hr tablet Take 25 mg by mouth daily.  . mometasone (NASONEX) 50 MCG/ACT nasal spray Place 2 sprays into the nose daily as needed.   . Multiple Vitamin (MULTIVITAMIN) tablet Take 1 tablet by mouth daily.  . nitroGLYCERIN (NITROSTAT) 0.4 MG SL tablet Place 0.4 mg under the tongue every 5 (five) minutes as needed for chest pain.  . prasugrel (EFFIENT) 10 MG TABS tablet Take 10 mg by mouth daily.  . roflumilast (DALIRESP) 500 MCG TABS tablet Take 500 mcg by mouth daily.  . SitaGLIPtin-MetFORMIN HCl (JANUMET XR) 50-500 MG TB24 Take by mouth daily.  Marland Kitchen tiotropium (SPIRIVA) 18 MCG inhalation capsule Place 18 mcg into inhaler and inhale daily.  . [DISCONTINUED] azithromycin (ZITHROMAX) 250 MG tablet Take two once then one daily until  gone  . [DISCONTINUED] predniSONE (DELTASONE) 10 MG tablet Take 4 tablets daily for 5 days then stop

## 2013-10-24 NOTE — Patient Instructions (Signed)
Focus on getting off last few cigarettes, consider nicorette minis lozenges 4mg  as needed  No other medication changes Use 3Liters with exertion Return 3 months

## 2013-10-25 NOTE — Assessment & Plan Note (Signed)
Gold D copd stable at present Needs to quit smoking No other medication changes Use 3Liters with exertion Return 3 months

## 2013-10-25 NOTE — Assessment & Plan Note (Signed)
Ongoing tobacco use Plan Tobacco counseling issued

## 2013-10-30 ENCOUNTER — Telehealth: Payer: Self-pay | Admitting: Critical Care Medicine

## 2013-10-30 NOTE — Telephone Encounter (Signed)
Called and spoke with pt and she stated that she received a call from [facility] letting her know that her advair will be shipped out in the next couple of days.  Pt stated that PW stopped the advair at her march ov.  i have called [facility] to cancel this for the pt.  Pt is aware.

## 2013-11-20 DIAGNOSIS — J449 Chronic obstructive pulmonary disease, unspecified: Secondary | ICD-10-CM | POA: Diagnosis not present

## 2013-11-20 DIAGNOSIS — IMO0002 Reserved for concepts with insufficient information to code with codable children: Secondary | ICD-10-CM | POA: Diagnosis not present

## 2013-11-23 DIAGNOSIS — J449 Chronic obstructive pulmonary disease, unspecified: Secondary | ICD-10-CM | POA: Diagnosis not present

## 2013-11-23 DIAGNOSIS — IMO0002 Reserved for concepts with insufficient information to code with codable children: Secondary | ICD-10-CM | POA: Diagnosis not present

## 2013-11-29 DIAGNOSIS — Z9981 Dependence on supplemental oxygen: Secondary | ICD-10-CM | POA: Diagnosis not present

## 2013-11-29 DIAGNOSIS — I110 Hypertensive heart disease with heart failure: Secondary | ICD-10-CM | POA: Diagnosis not present

## 2013-11-29 DIAGNOSIS — J438 Other emphysema: Secondary | ICD-10-CM | POA: Diagnosis not present

## 2013-11-29 DIAGNOSIS — J449 Chronic obstructive pulmonary disease, unspecified: Secondary | ICD-10-CM | POA: Diagnosis not present

## 2013-11-29 DIAGNOSIS — I509 Heart failure, unspecified: Secondary | ICD-10-CM | POA: Diagnosis not present

## 2013-12-20 DIAGNOSIS — E7800 Pure hypercholesterolemia, unspecified: Secondary | ICD-10-CM | POA: Diagnosis not present

## 2013-12-20 DIAGNOSIS — J438 Other emphysema: Secondary | ICD-10-CM | POA: Diagnosis not present

## 2013-12-20 DIAGNOSIS — E119 Type 2 diabetes mellitus without complications: Secondary | ICD-10-CM | POA: Diagnosis not present

## 2013-12-20 DIAGNOSIS — I10 Essential (primary) hypertension: Secondary | ICD-10-CM | POA: Diagnosis not present

## 2013-12-20 DIAGNOSIS — J441 Chronic obstructive pulmonary disease with (acute) exacerbation: Secondary | ICD-10-CM | POA: Diagnosis not present

## 2013-12-20 DIAGNOSIS — Z79899 Other long term (current) drug therapy: Secondary | ICD-10-CM | POA: Diagnosis not present

## 2013-12-20 DIAGNOSIS — J069 Acute upper respiratory infection, unspecified: Secondary | ICD-10-CM | POA: Diagnosis not present

## 2013-12-20 DIAGNOSIS — D649 Anemia, unspecified: Secondary | ICD-10-CM | POA: Diagnosis not present

## 2013-12-20 DIAGNOSIS — I509 Heart failure, unspecified: Secondary | ICD-10-CM | POA: Diagnosis not present

## 2013-12-20 DIAGNOSIS — J45901 Unspecified asthma with (acute) exacerbation: Secondary | ICD-10-CM | POA: Diagnosis not present

## 2013-12-20 DIAGNOSIS — R0602 Shortness of breath: Secondary | ICD-10-CM | POA: Diagnosis not present

## 2014-01-16 DIAGNOSIS — E1159 Type 2 diabetes mellitus with other circulatory complications: Secondary | ICD-10-CM | POA: Diagnosis not present

## 2014-01-16 DIAGNOSIS — E782 Mixed hyperlipidemia: Secondary | ICD-10-CM | POA: Diagnosis not present

## 2014-01-16 DIAGNOSIS — I119 Hypertensive heart disease without heart failure: Secondary | ICD-10-CM | POA: Diagnosis not present

## 2014-01-16 DIAGNOSIS — J449 Chronic obstructive pulmonary disease, unspecified: Secondary | ICD-10-CM | POA: Diagnosis not present

## 2014-01-23 DIAGNOSIS — E1159 Type 2 diabetes mellitus with other circulatory complications: Secondary | ICD-10-CM | POA: Diagnosis not present

## 2014-01-23 DIAGNOSIS — I798 Other disorders of arteries, arterioles and capillaries in diseases classified elsewhere: Secondary | ICD-10-CM | POA: Diagnosis not present

## 2014-01-23 DIAGNOSIS — I119 Hypertensive heart disease without heart failure: Secondary | ICD-10-CM | POA: Diagnosis not present

## 2014-01-23 DIAGNOSIS — J449 Chronic obstructive pulmonary disease, unspecified: Secondary | ICD-10-CM | POA: Diagnosis not present

## 2014-01-30 DIAGNOSIS — I119 Hypertensive heart disease without heart failure: Secondary | ICD-10-CM | POA: Diagnosis not present

## 2014-02-06 ENCOUNTER — Ambulatory Visit (INDEPENDENT_AMBULATORY_CARE_PROVIDER_SITE_OTHER): Payer: Self-pay | Admitting: Critical Care Medicine

## 2014-02-06 ENCOUNTER — Encounter: Payer: Self-pay | Admitting: Critical Care Medicine

## 2014-02-06 VITALS — BP 128/70 | HR 86 | Temp 98.2°F | Ht 63.5 in | Wt 129.6 lb

## 2014-02-06 DIAGNOSIS — J438 Other emphysema: Secondary | ICD-10-CM | POA: Diagnosis not present

## 2014-02-06 DIAGNOSIS — J432 Centrilobular emphysema: Secondary | ICD-10-CM

## 2014-02-06 DIAGNOSIS — J449 Chronic obstructive pulmonary disease, unspecified: Secondary | ICD-10-CM | POA: Diagnosis not present

## 2014-02-06 DIAGNOSIS — J9611 Chronic respiratory failure with hypoxia: Secondary | ICD-10-CM

## 2014-02-06 DIAGNOSIS — F17200 Nicotine dependence, unspecified, uncomplicated: Secondary | ICD-10-CM

## 2014-02-06 NOTE — Assessment & Plan Note (Signed)
Gold stage D copd  Stable at present No longer smoking! Plan Cont inhaled meds as prescribed

## 2014-02-06 NOTE — Assessment & Plan Note (Signed)
Chronic resp failure Plan Cont oxygen therapy

## 2014-02-06 NOTE — Progress Notes (Signed)
Subjective:    Patient ID: Samantha Wong, female    DOB: 11/25/1945, 68 y.o.   MRN: [id number]  HPI Comments: Dx 2009 Copd, on oxygen since 2011.  Normally 3L at home 2L exertion.  GOLD D Copd FeV1 23%  DLCO 8% predicted   02/06/2014 Chief Complaint  Patient presents with  . Follow-up    sob-same,cough-clear,no wheezing,denies cp or tightness  Now on insulin qhs  Min cough. Dyspnea is the same.  No wheezing or tightness. No chest pain. Pt denies any significant sore throat, nasal congestion or excess secretions, fever, chills, sweats, unintended weight loss, pleurtic or exertional chest pain, orthopnea PND, or leg swelling Pt denies any increase in rescue therapy over baseline, denies waking up needing it or having any early am or nocturnal exacerbations of coughing/wheezing/or dyspnea. Pt also denies any obvious fluctuation in symptoms with  weather or environmental change or other alleviating or aggravating factors  Last copd exac 10/2013: went to ED and d/c to home.  URI/bronchitis.  Still on oxygen 3L.   Quit smoking in 09/2013!!    Review of Systems  Constitutional: Negative for chills, diaphoresis, activity change, appetite change, fatigue and unexpected weight change.  HENT: Positive for congestion and sneezing. Negative for dental problem, ear discharge, facial swelling, hearing loss, mouth sores, nosebleeds, postnasal drip, sinus pressure, tinnitus, trouble swallowing and voice change.   Eyes: Positive for itching. Negative for photophobia, discharge and visual disturbance.  Respiratory: Positive for cough. Negative for apnea, choking, chest tightness and stridor.   Cardiovascular: Positive for palpitations.  Gastrointestinal: Negative for nausea, constipation, blood in stool and abdominal distention.  Genitourinary: Negative for dysuria, urgency, frequency, hematuria, flank pain, decreased urine volume and difficulty urinating.  Musculoskeletal: Negative for arthralgias, back  pain, gait problem, joint swelling, myalgias and neck stiffness.  Skin: Negative for color change and pallor.  Neurological: Negative for dizziness, tremors, seizures, syncope, speech difficulty, weakness, light-headedness and numbness.  Hematological: Negative for adenopathy. Bruises/bleeds easily.  Psychiatric/Behavioral: Positive for sleep disturbance. Negative for confusion and agitation. The patient is nervous/anxious.        Objective:   Physical Exam  Filed Vitals:   02/06/14 1508  BP: 128/70  Pulse: 86  Temp: 98.2 F (36.8 C)  TempSrc: Oral  Height: 5' 3.5" (1.613 m)  Weight: 129 lb 9.6 oz (58.786 kg)  SpO2: 90%    Gen: Pleasant, well-nourished, in no distress,  normal affect  ENT: No lesions,  mouth clear,  oropharynx clear, no postnasal drip  Neck: No JVD, no TMG, no carotid bruits  Lungs: No use of accessory muscles, no dullness to percussion, no wheeze  Cardiovascular: RRR, heart sounds normal, no murmur or gallops, no peripheral edema  Abdomen: soft and NT, no HSM,  BS normal  Musculoskeletal: No deformities, no cyanosis or clubbing  Neuro: alert, non focal  Skin: Warm, no lesions or rashes  No results found.        Assessment & Plan:   COPD (chronic obstructive pulmonary disease)Gold D Gold stage D copd  Stable at present No longer smoking! Plan Cont inhaled meds as prescribed  Tobacco use disorder No longer smoking since last ov   Chronic respiratory failure with hypoxia Chronic resp failure Plan Cont oxygen therapy    Updated Medication List Outpatient Encounter Prescriptions as of 02/06/2014  Medication Sig  . albuterol (PROVENTIL HFA;VENTOLIN HFA) 108 (90 BASE) MCG/ACT inhaler Inhale into the lungs every 4 (four) hours as needed for wheezing or  shortness of breath.  Marland Kitchen albuterol (PROVENTIL) (2.5 MG/3ML) 0.083% nebulizer solution Take 2.5 mg by nebulization 4 (four) times daily as needed for wheezing or shortness of breath.   Marland Kitchen  arformoterol (BROVANA) 15 MCG/2ML NEBU Take 15 mcg by nebulization 2 (two) times daily.  Marland Kitchen aspirin 81 MG tablet Take 81 mg by mouth daily.  Marland Kitchen atorvastatin (LIPITOR) 20 MG tablet Take 20 mg by mouth daily.  . budesonide (PULMICORT) 0.25 MG/2ML nebulizer solution Take 2 mLs (0.25 mg total) by nebulization daily.  . Calcium Carbonate-Vitamin D (CALCIUM-VITAMIN D) 600-125 MG-UNIT TABS Take by mouth daily.  . Cholecalciferol 1000 UNITS capsule Take 1,000 Units by mouth daily.  . digoxin (LANOXIN) 0.25 MG tablet Take 0.25 mg by mouth every other day.  . esomeprazole (NEXIUM) 40 MG packet Take 40 mg by mouth daily before breakfast.  . ferrous sulfate 325 (65 FE) MG tablet Take 325 mg by mouth daily with breakfast.  . furosemide (LASIX) 40 MG tablet Take 20 mg by mouth.  Marland Kitchen glimepiride (AMARYL) 1 MG tablet Take 1 mg by mouth daily with breakfast.   . insulin detemir (LEVEMIR) 100 UNIT/ML injection Inject 5 Units into the skin at bedtime.  Marland Kitchen loratadine (CLARITIN) 10 MG tablet Take 10 mg by mouth daily as needed.   Marland Kitchen losartan (COZAAR) 100 MG tablet Take 1 tablet (100 mg total) by mouth daily.  . metoprolol succinate (TOPROL-XL) 25 MG 24 hr tablet Take 25 mg by mouth daily.  . mometasone (NASONEX) 50 MCG/ACT nasal spray Place 2 sprays into the nose daily as needed.   . Multiple Vitamin (MULTIVITAMIN) tablet Take 1 tablet by mouth daily.  . nitroGLYCERIN (NITROSTAT) 0.4 MG SL tablet Place 0.4 mg under the tongue every 5 (five) minutes as needed for chest pain.  . prasugrel (EFFIENT) 10 MG TABS tablet Take 10 mg by mouth daily.  . roflumilast (DALIRESP) 500 MCG TABS tablet Take 500 mcg by mouth daily.  Marland Kitchen tiotropium (SPIRIVA) 18 MCG inhalation capsule Place 18 mcg into inhaler and inhale daily.  . [DISCONTINUED] SitaGLIPtin-MetFORMIN HCl (JANUMET XR) 50-500 MG TB24 Take by mouth daily.

## 2014-02-06 NOTE — Patient Instructions (Signed)
No change in medications. Return in     4 months Please obtain a flu vaccine this fall

## 2014-02-06 NOTE — Assessment & Plan Note (Signed)
No longer smoking since last ov

## 2014-03-06 DIAGNOSIS — H2703 Aphakia, bilateral: Secondary | ICD-10-CM | POA: Diagnosis not present

## 2014-03-09 DIAGNOSIS — Z79899 Other long term (current) drug therapy: Secondary | ICD-10-CM | POA: Diagnosis not present

## 2014-03-09 DIAGNOSIS — E780 Pure hypercholesterolemia: Secondary | ICD-10-CM | POA: Diagnosis not present

## 2014-03-09 DIAGNOSIS — E119 Type 2 diabetes mellitus without complications: Secondary | ICD-10-CM | POA: Diagnosis not present

## 2014-03-09 DIAGNOSIS — R05 Cough: Secondary | ICD-10-CM | POA: Diagnosis not present

## 2014-03-09 DIAGNOSIS — I509 Heart failure, unspecified: Secondary | ICD-10-CM | POA: Diagnosis not present

## 2014-03-09 DIAGNOSIS — R0602 Shortness of breath: Secondary | ICD-10-CM | POA: Diagnosis not present

## 2014-03-09 DIAGNOSIS — D649 Anemia, unspecified: Secondary | ICD-10-CM | POA: Diagnosis not present

## 2014-03-09 DIAGNOSIS — J441 Chronic obstructive pulmonary disease with (acute) exacerbation: Secondary | ICD-10-CM | POA: Diagnosis not present

## 2014-03-09 DIAGNOSIS — J449 Chronic obstructive pulmonary disease, unspecified: Secondary | ICD-10-CM | POA: Diagnosis not present

## 2014-03-09 DIAGNOSIS — I10 Essential (primary) hypertension: Secondary | ICD-10-CM | POA: Diagnosis not present

## 2014-03-12 DIAGNOSIS — E785 Hyperlipidemia, unspecified: Secondary | ICD-10-CM | POA: Diagnosis not present

## 2014-03-12 DIAGNOSIS — I2510 Atherosclerotic heart disease of native coronary artery without angina pectoris: Secondary | ICD-10-CM | POA: Diagnosis not present

## 2014-03-12 DIAGNOSIS — E119 Type 2 diabetes mellitus without complications: Secondary | ICD-10-CM | POA: Diagnosis not present

## 2014-03-12 DIAGNOSIS — I10 Essential (primary) hypertension: Secondary | ICD-10-CM | POA: Diagnosis not present

## 2014-03-29 DIAGNOSIS — Z23 Encounter for immunization: Secondary | ICD-10-CM | POA: Diagnosis not present

## 2014-04-11 DIAGNOSIS — Z6823 Body mass index (BMI) 23.0-23.9, adult: Secondary | ICD-10-CM | POA: Diagnosis not present

## 2014-04-11 DIAGNOSIS — Z9981 Dependence on supplemental oxygen: Secondary | ICD-10-CM | POA: Diagnosis not present

## 2014-04-11 DIAGNOSIS — J441 Chronic obstructive pulmonary disease with (acute) exacerbation: Secondary | ICD-10-CM | POA: Diagnosis not present

## 2014-04-24 DIAGNOSIS — I119 Hypertensive heart disease without heart failure: Secondary | ICD-10-CM | POA: Diagnosis not present

## 2014-04-24 DIAGNOSIS — E1159 Type 2 diabetes mellitus with other circulatory complications: Secondary | ICD-10-CM | POA: Diagnosis not present

## 2014-04-24 DIAGNOSIS — E784 Other hyperlipidemia: Secondary | ICD-10-CM | POA: Diagnosis not present

## 2014-04-24 DIAGNOSIS — E1140 Type 2 diabetes mellitus with diabetic neuropathy, unspecified: Secondary | ICD-10-CM | POA: Diagnosis not present

## 2014-04-24 DIAGNOSIS — E1165 Type 2 diabetes mellitus with hyperglycemia: Secondary | ICD-10-CM | POA: Diagnosis not present

## 2014-04-30 DIAGNOSIS — Z9981 Dependence on supplemental oxygen: Secondary | ICD-10-CM | POA: Diagnosis not present

## 2014-04-30 DIAGNOSIS — Z6821 Body mass index (BMI) 21.0-21.9, adult: Secondary | ICD-10-CM | POA: Diagnosis not present

## 2014-04-30 DIAGNOSIS — J449 Chronic obstructive pulmonary disease, unspecified: Secondary | ICD-10-CM | POA: Diagnosis not present

## 2014-04-30 DIAGNOSIS — E1159 Type 2 diabetes mellitus with other circulatory complications: Secondary | ICD-10-CM | POA: Diagnosis not present

## 2014-05-07 DIAGNOSIS — E1159 Type 2 diabetes mellitus with other circulatory complications: Secondary | ICD-10-CM | POA: Diagnosis not present

## 2014-05-24 DIAGNOSIS — Z6823 Body mass index (BMI) 23.0-23.9, adult: Secondary | ICD-10-CM | POA: Diagnosis not present

## 2014-05-24 DIAGNOSIS — J441 Chronic obstructive pulmonary disease with (acute) exacerbation: Secondary | ICD-10-CM | POA: Diagnosis not present

## 2014-06-11 DIAGNOSIS — J449 Chronic obstructive pulmonary disease, unspecified: Secondary | ICD-10-CM | POA: Diagnosis not present

## 2014-06-11 DIAGNOSIS — E1159 Type 2 diabetes mellitus with other circulatory complications: Secondary | ICD-10-CM | POA: Diagnosis not present

## 2014-06-11 DIAGNOSIS — E1165 Type 2 diabetes mellitus with hyperglycemia: Secondary | ICD-10-CM | POA: Diagnosis not present

## 2014-06-11 DIAGNOSIS — R202 Paresthesia of skin: Secondary | ICD-10-CM | POA: Diagnosis not present

## 2014-06-12 DIAGNOSIS — E785 Hyperlipidemia, unspecified: Secondary | ICD-10-CM | POA: Diagnosis not present

## 2014-06-12 DIAGNOSIS — Z87891 Personal history of nicotine dependence: Secondary | ICD-10-CM | POA: Diagnosis not present

## 2014-06-12 DIAGNOSIS — I10 Essential (primary) hypertension: Secondary | ICD-10-CM | POA: Diagnosis not present

## 2014-06-12 DIAGNOSIS — I2510 Atherosclerotic heart disease of native coronary artery without angina pectoris: Secondary | ICD-10-CM | POA: Diagnosis not present

## 2014-06-12 DIAGNOSIS — E119 Type 2 diabetes mellitus without complications: Secondary | ICD-10-CM | POA: Diagnosis not present

## 2014-06-14 DIAGNOSIS — J449 Chronic obstructive pulmonary disease, unspecified: Secondary | ICD-10-CM | POA: Diagnosis not present

## 2014-06-21 DIAGNOSIS — R011 Cardiac murmur, unspecified: Secondary | ICD-10-CM | POA: Diagnosis not present

## 2014-06-21 DIAGNOSIS — I10 Essential (primary) hypertension: Secondary | ICD-10-CM | POA: Diagnosis not present

## 2014-06-21 DIAGNOSIS — I2510 Atherosclerotic heart disease of native coronary artery without angina pectoris: Secondary | ICD-10-CM | POA: Diagnosis not present

## 2014-06-21 DIAGNOSIS — I369 Nonrheumatic tricuspid valve disorder, unspecified: Secondary | ICD-10-CM | POA: Diagnosis not present

## 2014-06-25 DIAGNOSIS — J101 Influenza due to other identified influenza virus with other respiratory manifestations: Secondary | ICD-10-CM | POA: Diagnosis not present

## 2014-06-25 DIAGNOSIS — J029 Acute pharyngitis, unspecified: Secondary | ICD-10-CM | POA: Diagnosis not present

## 2014-06-25 DIAGNOSIS — Z6823 Body mass index (BMI) 23.0-23.9, adult: Secondary | ICD-10-CM | POA: Diagnosis not present

## 2014-06-25 DIAGNOSIS — R05 Cough: Secondary | ICD-10-CM | POA: Diagnosis not present

## 2014-07-30 DIAGNOSIS — Z79899 Other long term (current) drug therapy: Secondary | ICD-10-CM | POA: Diagnosis not present

## 2014-07-30 DIAGNOSIS — E784 Other hyperlipidemia: Secondary | ICD-10-CM | POA: Diagnosis not present

## 2014-07-31 DIAGNOSIS — J449 Chronic obstructive pulmonary disease, unspecified: Secondary | ICD-10-CM | POA: Diagnosis not present

## 2014-07-31 DIAGNOSIS — Z87891 Personal history of nicotine dependence: Secondary | ICD-10-CM | POA: Diagnosis not present

## 2014-07-31 DIAGNOSIS — E119 Type 2 diabetes mellitus without complications: Secondary | ICD-10-CM | POA: Diagnosis not present

## 2014-08-01 DIAGNOSIS — E119 Type 2 diabetes mellitus without complications: Secondary | ICD-10-CM | POA: Diagnosis not present

## 2014-08-01 DIAGNOSIS — J449 Chronic obstructive pulmonary disease, unspecified: Secondary | ICD-10-CM | POA: Diagnosis not present

## 2014-08-01 DIAGNOSIS — Z87891 Personal history of nicotine dependence: Secondary | ICD-10-CM | POA: Diagnosis not present

## 2014-08-02 DIAGNOSIS — J449 Chronic obstructive pulmonary disease, unspecified: Secondary | ICD-10-CM | POA: Diagnosis not present

## 2014-08-02 DIAGNOSIS — E119 Type 2 diabetes mellitus without complications: Secondary | ICD-10-CM | POA: Diagnosis not present

## 2014-08-02 DIAGNOSIS — Z87891 Personal history of nicotine dependence: Secondary | ICD-10-CM | POA: Diagnosis not present

## 2014-08-06 DIAGNOSIS — J449 Chronic obstructive pulmonary disease, unspecified: Secondary | ICD-10-CM | POA: Diagnosis not present

## 2014-08-06 DIAGNOSIS — Z87891 Personal history of nicotine dependence: Secondary | ICD-10-CM | POA: Diagnosis not present

## 2014-08-06 DIAGNOSIS — E119 Type 2 diabetes mellitus without complications: Secondary | ICD-10-CM | POA: Diagnosis not present

## 2014-08-08 DIAGNOSIS — E119 Type 2 diabetes mellitus without complications: Secondary | ICD-10-CM | POA: Diagnosis not present

## 2014-08-08 DIAGNOSIS — J449 Chronic obstructive pulmonary disease, unspecified: Secondary | ICD-10-CM | POA: Diagnosis not present

## 2014-08-08 DIAGNOSIS — Z87891 Personal history of nicotine dependence: Secondary | ICD-10-CM | POA: Diagnosis not present

## 2014-08-09 DIAGNOSIS — E785 Hyperlipidemia, unspecified: Secondary | ICD-10-CM | POA: Diagnosis not present

## 2014-08-09 DIAGNOSIS — E1159 Type 2 diabetes mellitus with other circulatory complications: Secondary | ICD-10-CM | POA: Diagnosis not present

## 2014-08-09 DIAGNOSIS — E1165 Type 2 diabetes mellitus with hyperglycemia: Secondary | ICD-10-CM | POA: Diagnosis not present

## 2014-08-09 DIAGNOSIS — I119 Hypertensive heart disease without heart failure: Secondary | ICD-10-CM | POA: Diagnosis not present

## 2014-08-10 DIAGNOSIS — Z87891 Personal history of nicotine dependence: Secondary | ICD-10-CM | POA: Diagnosis not present

## 2014-08-10 DIAGNOSIS — E119 Type 2 diabetes mellitus without complications: Secondary | ICD-10-CM | POA: Diagnosis not present

## 2014-08-10 DIAGNOSIS — J449 Chronic obstructive pulmonary disease, unspecified: Secondary | ICD-10-CM | POA: Diagnosis not present

## 2014-08-13 DIAGNOSIS — J449 Chronic obstructive pulmonary disease, unspecified: Secondary | ICD-10-CM | POA: Diagnosis not present

## 2014-08-13 DIAGNOSIS — E119 Type 2 diabetes mellitus without complications: Secondary | ICD-10-CM | POA: Diagnosis not present

## 2014-08-13 DIAGNOSIS — Z87891 Personal history of nicotine dependence: Secondary | ICD-10-CM | POA: Diagnosis not present

## 2014-08-15 DIAGNOSIS — J449 Chronic obstructive pulmonary disease, unspecified: Secondary | ICD-10-CM | POA: Diagnosis not present

## 2014-08-15 DIAGNOSIS — Z87891 Personal history of nicotine dependence: Secondary | ICD-10-CM | POA: Diagnosis not present

## 2014-08-15 DIAGNOSIS — E119 Type 2 diabetes mellitus without complications: Secondary | ICD-10-CM | POA: Diagnosis not present

## 2014-08-17 DIAGNOSIS — J449 Chronic obstructive pulmonary disease, unspecified: Secondary | ICD-10-CM | POA: Diagnosis not present

## 2014-08-17 DIAGNOSIS — Z87891 Personal history of nicotine dependence: Secondary | ICD-10-CM | POA: Diagnosis not present

## 2014-08-17 DIAGNOSIS — E119 Type 2 diabetes mellitus without complications: Secondary | ICD-10-CM | POA: Diagnosis not present

## 2014-08-20 DIAGNOSIS — J449 Chronic obstructive pulmonary disease, unspecified: Secondary | ICD-10-CM | POA: Diagnosis not present

## 2014-08-20 DIAGNOSIS — Z87891 Personal history of nicotine dependence: Secondary | ICD-10-CM | POA: Diagnosis not present

## 2014-08-20 DIAGNOSIS — E119 Type 2 diabetes mellitus without complications: Secondary | ICD-10-CM | POA: Diagnosis not present

## 2014-08-22 DIAGNOSIS — E119 Type 2 diabetes mellitus without complications: Secondary | ICD-10-CM | POA: Diagnosis not present

## 2014-08-22 DIAGNOSIS — J449 Chronic obstructive pulmonary disease, unspecified: Secondary | ICD-10-CM | POA: Diagnosis not present

## 2014-08-22 DIAGNOSIS — Z87891 Personal history of nicotine dependence: Secondary | ICD-10-CM | POA: Diagnosis not present

## 2014-08-27 DIAGNOSIS — E119 Type 2 diabetes mellitus without complications: Secondary | ICD-10-CM | POA: Diagnosis not present

## 2014-08-27 DIAGNOSIS — Z87891 Personal history of nicotine dependence: Secondary | ICD-10-CM | POA: Diagnosis not present

## 2014-08-27 DIAGNOSIS — J449 Chronic obstructive pulmonary disease, unspecified: Secondary | ICD-10-CM | POA: Diagnosis not present

## 2014-08-29 DIAGNOSIS — E119 Type 2 diabetes mellitus without complications: Secondary | ICD-10-CM | POA: Diagnosis not present

## 2014-08-29 DIAGNOSIS — Z87891 Personal history of nicotine dependence: Secondary | ICD-10-CM | POA: Diagnosis not present

## 2014-08-29 DIAGNOSIS — J449 Chronic obstructive pulmonary disease, unspecified: Secondary | ICD-10-CM | POA: Diagnosis not present

## 2014-08-31 DIAGNOSIS — E119 Type 2 diabetes mellitus without complications: Secondary | ICD-10-CM | POA: Diagnosis not present

## 2014-08-31 DIAGNOSIS — J449 Chronic obstructive pulmonary disease, unspecified: Secondary | ICD-10-CM | POA: Diagnosis not present

## 2014-09-13 DIAGNOSIS — E1151 Type 2 diabetes mellitus with diabetic peripheral angiopathy without gangrene: Secondary | ICD-10-CM | POA: Diagnosis not present

## 2014-09-13 DIAGNOSIS — J449 Chronic obstructive pulmonary disease, unspecified: Secondary | ICD-10-CM | POA: Diagnosis not present

## 2014-09-13 DIAGNOSIS — Z6822 Body mass index (BMI) 22.0-22.9, adult: Secondary | ICD-10-CM | POA: Diagnosis not present

## 2014-09-13 DIAGNOSIS — E1165 Type 2 diabetes mellitus with hyperglycemia: Secondary | ICD-10-CM | POA: Diagnosis not present

## 2014-10-01 DIAGNOSIS — E119 Type 2 diabetes mellitus without complications: Secondary | ICD-10-CM | POA: Diagnosis not present

## 2014-10-01 DIAGNOSIS — J449 Chronic obstructive pulmonary disease, unspecified: Secondary | ICD-10-CM | POA: Diagnosis not present

## 2014-10-01 DIAGNOSIS — Z87891 Personal history of nicotine dependence: Secondary | ICD-10-CM | POA: Diagnosis not present

## 2014-10-11 DIAGNOSIS — J449 Chronic obstructive pulmonary disease, unspecified: Secondary | ICD-10-CM | POA: Diagnosis not present

## 2014-10-11 DIAGNOSIS — E1151 Type 2 diabetes mellitus with diabetic peripheral angiopathy without gangrene: Secondary | ICD-10-CM | POA: Diagnosis not present

## 2014-10-11 DIAGNOSIS — E1165 Type 2 diabetes mellitus with hyperglycemia: Secondary | ICD-10-CM | POA: Diagnosis not present

## 2014-10-11 DIAGNOSIS — Z9981 Dependence on supplemental oxygen: Secondary | ICD-10-CM | POA: Diagnosis not present

## 2014-10-22 ENCOUNTER — Telehealth: Payer: Self-pay | Admitting: *Deleted

## 2014-10-22 ENCOUNTER — Other Ambulatory Visit: Payer: Self-pay | Admitting: Critical Care Medicine

## 2014-10-22 MED ORDER — LOSARTAN POTASSIUM 100 MG PO TABS: 100.0000 mg | ORAL_TABLET | Freq: Every day | ORAL | Status: AC

## 2014-10-22 NOTE — Telephone Encounter (Signed)
Refill request received from patient's mail order. Patient last seen 01/2014 and she was to be seen in 4 months. Called and explained to patient we could send 30 day rx but she needs to call office before future refills. Pt verbalizes understanding but wants to be seen in the [place] office.

## 2014-10-31 DIAGNOSIS — E119 Type 2 diabetes mellitus without complications: Secondary | ICD-10-CM | POA: Diagnosis not present

## 2014-10-31 DIAGNOSIS — Z87891 Personal history of nicotine dependence: Secondary | ICD-10-CM | POA: Diagnosis not present

## 2014-10-31 DIAGNOSIS — J449 Chronic obstructive pulmonary disease, unspecified: Secondary | ICD-10-CM | POA: Diagnosis not present

## 2014-11-12 DIAGNOSIS — J449 Chronic obstructive pulmonary disease, unspecified: Secondary | ICD-10-CM | POA: Diagnosis not present

## 2014-11-12 DIAGNOSIS — Z9981 Dependence on supplemental oxygen: Secondary | ICD-10-CM | POA: Diagnosis not present

## 2014-11-12 DIAGNOSIS — I119 Hypertensive heart disease without heart failure: Secondary | ICD-10-CM | POA: Diagnosis not present

## 2014-11-12 DIAGNOSIS — E785 Hyperlipidemia, unspecified: Secondary | ICD-10-CM | POA: Diagnosis not present

## 2014-11-12 DIAGNOSIS — E1165 Type 2 diabetes mellitus with hyperglycemia: Secondary | ICD-10-CM | POA: Diagnosis not present

## 2014-11-12 DIAGNOSIS — E1151 Type 2 diabetes mellitus with diabetic peripheral angiopathy without gangrene: Secondary | ICD-10-CM | POA: Diagnosis not present

## 2014-11-20 ENCOUNTER — Ambulatory Visit: Payer: Self-pay | Admitting: Critical Care Medicine

## 2014-11-27 DIAGNOSIS — J449 Chronic obstructive pulmonary disease, unspecified: Secondary | ICD-10-CM | POA: Diagnosis not present

## 2014-11-27 DIAGNOSIS — E1140 Type 2 diabetes mellitus with diabetic neuropathy, unspecified: Secondary | ICD-10-CM | POA: Diagnosis not present

## 2014-11-27 DIAGNOSIS — E1165 Type 2 diabetes mellitus with hyperglycemia: Secondary | ICD-10-CM | POA: Diagnosis not present

## 2014-11-27 DIAGNOSIS — I119 Hypertensive heart disease without heart failure: Secondary | ICD-10-CM | POA: Diagnosis not present

## 2014-11-27 DIAGNOSIS — I5030 Unspecified diastolic (congestive) heart failure: Secondary | ICD-10-CM | POA: Diagnosis not present

## 2014-12-06 DIAGNOSIS — I2510 Atherosclerotic heart disease of native coronary artery without angina pectoris: Secondary | ICD-10-CM | POA: Diagnosis not present

## 2014-12-06 DIAGNOSIS — I10 Essential (primary) hypertension: Secondary | ICD-10-CM | POA: Diagnosis not present

## 2014-12-06 DIAGNOSIS — J449 Chronic obstructive pulmonary disease, unspecified: Secondary | ICD-10-CM | POA: Diagnosis not present

## 2014-12-06 DIAGNOSIS — E785 Hyperlipidemia, unspecified: Secondary | ICD-10-CM | POA: Diagnosis not present

## 2015-01-15 ENCOUNTER — Encounter: Payer: Self-pay | Admitting: Critical Care Medicine

## 2015-01-15 ENCOUNTER — Ambulatory Visit (INDEPENDENT_AMBULATORY_CARE_PROVIDER_SITE_OTHER): Payer: Self-pay | Admitting: Critical Care Medicine

## 2015-01-15 VITALS — BP 154/60 | HR 79 | Temp 97.5°F | Ht 63.0 in | Wt 137.2 lb

## 2015-01-15 DIAGNOSIS — J449 Chronic obstructive pulmonary disease, unspecified: Secondary | ICD-10-CM

## 2015-01-15 DIAGNOSIS — J9611 Chronic respiratory failure with hypoxia: Secondary | ICD-10-CM

## 2015-01-15 DIAGNOSIS — J209 Acute bronchitis, unspecified: Secondary | ICD-10-CM

## 2015-01-15 DIAGNOSIS — J418 Mixed simple and mucopurulent chronic bronchitis: Secondary | ICD-10-CM

## 2015-01-15 MED ORDER — ROFLUMILAST 500 MCG PO TABS: 500.0000 ug | ORAL_TABLET | Freq: Every day | ORAL | Status: AC

## 2015-01-15 MED ORDER — CEFUROXIME AXETIL 500 MG PO TABS: 500.0000 mg | ORAL_TABLET | Freq: Two times a day (BID) | ORAL | Status: DC

## 2015-01-15 NOTE — Assessment & Plan Note (Signed)
Chronic obstructive lung disease gold stage D Mild acute exacerbation at this time Plan Daliresp refilled Take cefuroxime  twice daily for 5 days No other changes Return 1 year

## 2015-01-15 NOTE — Progress Notes (Signed)
Subjective:    Patient ID: Samantha Wong, female    DOB: 15-Oct-1945, 69 y.o.   MRN: [id number]  HPI 01/15/2015 Chief Complaint  Patient presents with  . Yearly Follow up     o2 sat 77% on 3lpm pulsed o2 upon arrival to exam room.  Has good and bad days.  Prod cough with green mucus and increased SOB x 2 days.  No wheezing, chest tightness, CP, hemoptysis, or f/c/s.   Annual f/u.  No sig change. No hosp stays or ed visits.  Has good and bad days. Notes more green mucus last few days, nasal drainage is watery.  No chest pain. No real wheezing. Pt denies any significant sore throat,  or excess secretions, fever, chills, sweats, unintended weight loss, pleurtic or exertional chest pain, orthopnea PND, or leg swelling Pt notes  increase in rescue therapy over baseline with hot weather , denies waking up needing it or having any early am or nocturnal exacerbations of coughing/wheezing/or dyspnea. Pt also denies any obvious fluctuation in symptoms with alleviating or aggravating factors but is worse in hot weather    Current Medications, Allergies, Complete Past Medical History, Past Surgical History, Family History, and Social History were reviewed in [employer] electronic medical record per todays encounter:  01/15/2015  Review of Systems  Constitutional: Negative.   HENT: Positive for postnasal drip and rhinorrhea. Negative for ear pain, sinus pressure, sore throat, trouble swallowing and voice change.   Eyes: Negative.   Respiratory: Positive for cough, shortness of breath and wheezing. Negative for apnea, choking, chest tightness and stridor.   Cardiovascular: Negative.  Negative for chest pain, palpitations and leg swelling.  Gastrointestinal: Negative.  Negative for nausea, vomiting, abdominal pain and abdominal distention.  Genitourinary: Negative.   Musculoskeletal: Negative.  Negative for myalgias and arthralgias.  Skin: Negative.  Negative for rash.  Allergic/Immunologic:  Negative.  Negative for environmental allergies and food allergies.  Neurological: Negative.  Negative for dizziness, syncope, weakness and headaches.  Hematological: Negative.  Negative for adenopathy. Does not bruise/bleed easily.  Psychiatric/Behavioral: Negative.  Negative for sleep disturbance and agitation. The patient is not nervous/anxious.        Objective:   Physical Exam Filed Vitals:   01/15/15 0856 01/15/15 0901  BP:  154/60  Pulse:  79  Temp:  97.5 F (36.4 C)  TempSrc:  Oral  Height:  5\' 3"  (1.6 m)  Weight:  137 lb 3.2 oz (62.234 kg)  SpO2: 77% 92%    Gen: Pleasant, well-nourished, in no distress,  normal affect  ENT: No lesions,  mouth clear,  oropharynx clear, no postnasal drip  Neck: No JVD, no TMG, no carotid bruits  Lungs: No use of accessory muscles, no dullness to percussion, expired wheezes poor airflow   Cardiovascular: RRR, heart sounds normal, no murmur or gallops, no peripheral edema  Abdomen: soft and NT, no HSM,  BS normal  Musculoskeletal: No deformities, no cyanosis or clubbing  Neuro: alert, non focal  Skin: Warm, no lesions or rashes  No results found.        Assessment & Plan:  I personally reviewed all images and lab data in the [facility] system as well as any outside material available during this office visit and agree with the  radiology impressions.   COPD (chronic obstructive pulmonary disease)Gold D Chronic obstructive lung disease gold stage D Mild acute exacerbation at this time Plan Daliresp refilled Take cefuroxime 500mg  twice daily for 5 days No other  changes Return 1 year     Samantha Wong was seen today for yearly follow up.  Diagnoses and all orders for this visit:  Acute bronchitis, unspecified organism  COPD with chronic bronchitis  Chronic respiratory failure with hypoxia  Mixed simple and mucopurulent chronic bronchitis  Other orders -     roflumilast (DALIRESP) 500 MCG TABS tablet; Take 1 tablet (500 mcg  total) by mouth daily. -     cefUROXime (CEFTIN) 500 MG tablet; Take 1 tablet (500 mg total) by mouth 2 (two) times daily.

## 2015-01-15 NOTE — Patient Instructions (Signed)
Daliresp refilled Take cefuroxime  twice daily for 5 days No other changes Return 1 year

## 2015-02-21 DIAGNOSIS — E1151 Type 2 diabetes mellitus with diabetic peripheral angiopathy without gangrene: Secondary | ICD-10-CM | POA: Diagnosis not present

## 2015-02-21 DIAGNOSIS — E1165 Type 2 diabetes mellitus with hyperglycemia: Secondary | ICD-10-CM | POA: Diagnosis not present

## 2015-02-21 DIAGNOSIS — Z9181 History of falling: Secondary | ICD-10-CM | POA: Diagnosis not present

## 2015-02-21 DIAGNOSIS — Z0000 Encounter for general adult medical examination without abnormal findings: Secondary | ICD-10-CM | POA: Diagnosis not present

## 2015-02-21 DIAGNOSIS — I119 Hypertensive heart disease without heart failure: Secondary | ICD-10-CM | POA: Diagnosis not present

## 2015-02-21 DIAGNOSIS — E785 Hyperlipidemia, unspecified: Secondary | ICD-10-CM | POA: Diagnosis not present

## 2015-02-21 DIAGNOSIS — Z139 Encounter for screening, unspecified: Secondary | ICD-10-CM | POA: Diagnosis not present

## 2015-02-21 DIAGNOSIS — Z1389 Encounter for screening for other disorder: Secondary | ICD-10-CM | POA: Diagnosis not present

## 2015-02-28 DIAGNOSIS — I119 Hypertensive heart disease without heart failure: Secondary | ICD-10-CM | POA: Diagnosis not present

## 2015-02-28 DIAGNOSIS — E1165 Type 2 diabetes mellitus with hyperglycemia: Secondary | ICD-10-CM | POA: Diagnosis not present

## 2015-02-28 DIAGNOSIS — E785 Hyperlipidemia, unspecified: Secondary | ICD-10-CM | POA: Diagnosis not present

## 2015-02-28 DIAGNOSIS — E1151 Type 2 diabetes mellitus with diabetic peripheral angiopathy without gangrene: Secondary | ICD-10-CM | POA: Diagnosis not present

## 2015-03-05 DIAGNOSIS — Z23 Encounter for immunization: Secondary | ICD-10-CM | POA: Diagnosis not present

## 2015-03-05 DIAGNOSIS — E1151 Type 2 diabetes mellitus with diabetic peripheral angiopathy without gangrene: Secondary | ICD-10-CM | POA: Diagnosis not present

## 2015-03-06 DIAGNOSIS — E119 Type 2 diabetes mellitus without complications: Secondary | ICD-10-CM | POA: Diagnosis not present

## 2015-03-06 DIAGNOSIS — H2703 Aphakia, bilateral: Secondary | ICD-10-CM | POA: Diagnosis not present

## 2015-03-07 DIAGNOSIS — J9610 Chronic respiratory failure, unspecified whether with hypoxia or hypercapnia: Secondary | ICD-10-CM | POA: Diagnosis not present

## 2015-03-07 DIAGNOSIS — J449 Chronic obstructive pulmonary disease, unspecified: Secondary | ICD-10-CM | POA: Diagnosis not present

## 2015-03-07 DIAGNOSIS — Z9981 Dependence on supplemental oxygen: Secondary | ICD-10-CM | POA: Diagnosis not present

## 2015-03-07 DIAGNOSIS — Z6823 Body mass index (BMI) 23.0-23.9, adult: Secondary | ICD-10-CM | POA: Diagnosis not present

## 2015-04-01 ENCOUNTER — Telehealth: Payer: Self-pay | Admitting: Critical Care Medicine

## 2015-04-01 NOTE — Telephone Encounter (Signed)
Called and spoke to pt. Pt requesting refill of losartan. Advised pt to have PCP fill the med. Pt verbalized understanding and denied any further questions or concerns at this time.

## 2015-04-29 DIAGNOSIS — Z6824 Body mass index (BMI) 24.0-24.9, adult: Secondary | ICD-10-CM | POA: Diagnosis not present

## 2015-04-29 DIAGNOSIS — J449 Chronic obstructive pulmonary disease, unspecified: Secondary | ICD-10-CM | POA: Diagnosis not present

## 2015-05-17 DIAGNOSIS — Z6824 Body mass index (BMI) 24.0-24.9, adult: Secondary | ICD-10-CM | POA: Diagnosis not present

## 2015-05-17 DIAGNOSIS — J329 Chronic sinusitis, unspecified: Secondary | ICD-10-CM | POA: Diagnosis not present

## 2015-05-22 DIAGNOSIS — E1151 Type 2 diabetes mellitus with diabetic peripheral angiopathy without gangrene: Secondary | ICD-10-CM | POA: Diagnosis not present

## 2015-05-22 DIAGNOSIS — Z79899 Other long term (current) drug therapy: Secondary | ICD-10-CM | POA: Diagnosis not present

## 2015-05-22 DIAGNOSIS — E785 Hyperlipidemia, unspecified: Secondary | ICD-10-CM | POA: Diagnosis not present

## 2015-05-22 DIAGNOSIS — E1165 Type 2 diabetes mellitus with hyperglycemia: Secondary | ICD-10-CM | POA: Diagnosis not present

## 2015-05-22 DIAGNOSIS — I119 Hypertensive heart disease without heart failure: Secondary | ICD-10-CM | POA: Diagnosis not present

## 2015-05-29 DIAGNOSIS — I10 Essential (primary) hypertension: Secondary | ICD-10-CM | POA: Diagnosis not present

## 2015-05-29 DIAGNOSIS — J029 Acute pharyngitis, unspecified: Secondary | ICD-10-CM | POA: Diagnosis not present

## 2015-05-29 DIAGNOSIS — R05 Cough: Secondary | ICD-10-CM | POA: Diagnosis not present

## 2015-05-29 DIAGNOSIS — E1151 Type 2 diabetes mellitus with diabetic peripheral angiopathy without gangrene: Secondary | ICD-10-CM | POA: Diagnosis not present

## 2015-05-29 DIAGNOSIS — E1165 Type 2 diabetes mellitus with hyperglycemia: Secondary | ICD-10-CM | POA: Diagnosis not present

## 2015-05-29 DIAGNOSIS — E785 Hyperlipidemia, unspecified: Secondary | ICD-10-CM | POA: Diagnosis not present

## 2015-06-17 DIAGNOSIS — E785 Hyperlipidemia, unspecified: Secondary | ICD-10-CM | POA: Diagnosis not present

## 2015-06-17 DIAGNOSIS — I2510 Atherosclerotic heart disease of native coronary artery without angina pectoris: Secondary | ICD-10-CM | POA: Diagnosis not present

## 2015-06-17 DIAGNOSIS — I10 Essential (primary) hypertension: Secondary | ICD-10-CM | POA: Diagnosis not present

## 2015-06-17 DIAGNOSIS — J432 Centrilobular emphysema: Secondary | ICD-10-CM | POA: Diagnosis not present

## 2015-07-22 DIAGNOSIS — R197 Diarrhea, unspecified: Secondary | ICD-10-CM | POA: Diagnosis not present

## 2015-07-22 DIAGNOSIS — Z9981 Dependence on supplemental oxygen: Secondary | ICD-10-CM | POA: Diagnosis not present

## 2015-07-22 DIAGNOSIS — J9610 Chronic respiratory failure, unspecified whether with hypoxia or hypercapnia: Secondary | ICD-10-CM | POA: Diagnosis not present

## 2015-07-22 DIAGNOSIS — J441 Chronic obstructive pulmonary disease with (acute) exacerbation: Secondary | ICD-10-CM | POA: Diagnosis not present

## 2015-07-29 DIAGNOSIS — I2510 Atherosclerotic heart disease of native coronary artery without angina pectoris: Secondary | ICD-10-CM | POA: Diagnosis not present

## 2015-07-29 DIAGNOSIS — J432 Centrilobular emphysema: Secondary | ICD-10-CM | POA: Diagnosis not present

## 2015-07-29 DIAGNOSIS — E785 Hyperlipidemia, unspecified: Secondary | ICD-10-CM | POA: Diagnosis not present

## 2015-07-29 DIAGNOSIS — I10 Essential (primary) hypertension: Secondary | ICD-10-CM | POA: Diagnosis not present

## 2015-08-04 DIAGNOSIS — J209 Acute bronchitis, unspecified: Secondary | ICD-10-CM | POA: Diagnosis not present

## 2015-08-04 DIAGNOSIS — J449 Chronic obstructive pulmonary disease, unspecified: Secondary | ICD-10-CM | POA: Diagnosis not present

## 2015-08-04 DIAGNOSIS — J111 Influenza due to unidentified influenza virus with other respiratory manifestations: Secondary | ICD-10-CM | POA: Diagnosis not present

## 2015-08-20 DIAGNOSIS — Z79899 Other long term (current) drug therapy: Secondary | ICD-10-CM | POA: Diagnosis not present

## 2015-08-20 DIAGNOSIS — E785 Hyperlipidemia, unspecified: Secondary | ICD-10-CM | POA: Diagnosis not present

## 2015-08-28 DIAGNOSIS — E1122 Type 2 diabetes mellitus with diabetic chronic kidney disease: Secondary | ICD-10-CM | POA: Diagnosis not present

## 2015-08-28 DIAGNOSIS — I119 Hypertensive heart disease without heart failure: Secondary | ICD-10-CM | POA: Diagnosis not present

## 2015-08-28 DIAGNOSIS — N183 Chronic kidney disease, stage 3 (moderate): Secondary | ICD-10-CM | POA: Diagnosis not present

## 2015-08-28 DIAGNOSIS — E1151 Type 2 diabetes mellitus with diabetic peripheral angiopathy without gangrene: Secondary | ICD-10-CM | POA: Diagnosis not present

## 2015-08-28 DIAGNOSIS — E1165 Type 2 diabetes mellitus with hyperglycemia: Secondary | ICD-10-CM | POA: Diagnosis not present

## 2015-09-11 DIAGNOSIS — J441 Chronic obstructive pulmonary disease with (acute) exacerbation: Secondary | ICD-10-CM | POA: Diagnosis not present

## 2015-09-11 DIAGNOSIS — J9610 Chronic respiratory failure, unspecified whether with hypoxia or hypercapnia: Secondary | ICD-10-CM | POA: Diagnosis not present

## 2015-09-11 DIAGNOSIS — Z6824 Body mass index (BMI) 24.0-24.9, adult: Secondary | ICD-10-CM | POA: Diagnosis not present

## 2015-09-11 DIAGNOSIS — Z9981 Dependence on supplemental oxygen: Secondary | ICD-10-CM | POA: Diagnosis not present

## 2015-10-10 DIAGNOSIS — J449 Chronic obstructive pulmonary disease, unspecified: Secondary | ICD-10-CM | POA: Diagnosis not present

## 2015-10-17 DIAGNOSIS — Z7189 Other specified counseling: Secondary | ICD-10-CM | POA: Diagnosis not present

## 2015-10-17 DIAGNOSIS — J449 Chronic obstructive pulmonary disease, unspecified: Secondary | ICD-10-CM | POA: Diagnosis not present

## 2015-10-17 DIAGNOSIS — E1140 Type 2 diabetes mellitus with diabetic neuropathy, unspecified: Secondary | ICD-10-CM | POA: Diagnosis not present

## 2015-10-17 DIAGNOSIS — E1165 Type 2 diabetes mellitus with hyperglycemia: Secondary | ICD-10-CM | POA: Diagnosis not present

## 2015-10-17 DIAGNOSIS — I5030 Unspecified diastolic (congestive) heart failure: Secondary | ICD-10-CM | POA: Diagnosis not present

## 2015-11-05 DIAGNOSIS — Z79899 Other long term (current) drug therapy: Secondary | ICD-10-CM | POA: Diagnosis not present

## 2015-11-05 DIAGNOSIS — E1151 Type 2 diabetes mellitus with diabetic peripheral angiopathy without gangrene: Secondary | ICD-10-CM | POA: Diagnosis not present

## 2015-11-05 DIAGNOSIS — E1165 Type 2 diabetes mellitus with hyperglycemia: Secondary | ICD-10-CM | POA: Diagnosis not present

## 2015-11-05 DIAGNOSIS — E785 Hyperlipidemia, unspecified: Secondary | ICD-10-CM | POA: Diagnosis not present

## 2015-11-05 DIAGNOSIS — I119 Hypertensive heart disease without heart failure: Secondary | ICD-10-CM | POA: Diagnosis not present

## 2015-11-14 DIAGNOSIS — E1165 Type 2 diabetes mellitus with hyperglycemia: Secondary | ICD-10-CM | POA: Diagnosis not present

## 2015-11-14 DIAGNOSIS — E1151 Type 2 diabetes mellitus with diabetic peripheral angiopathy without gangrene: Secondary | ICD-10-CM | POA: Diagnosis not present

## 2015-11-14 DIAGNOSIS — E785 Hyperlipidemia, unspecified: Secondary | ICD-10-CM | POA: Diagnosis not present

## 2015-11-14 DIAGNOSIS — Z7189 Other specified counseling: Secondary | ICD-10-CM | POA: Diagnosis not present

## 2015-11-14 DIAGNOSIS — I119 Hypertensive heart disease without heart failure: Secondary | ICD-10-CM | POA: Diagnosis not present

## 2015-11-20 DIAGNOSIS — J449 Chronic obstructive pulmonary disease, unspecified: Secondary | ICD-10-CM | POA: Diagnosis not present

## 2015-11-20 DIAGNOSIS — E1165 Type 2 diabetes mellitus with hyperglycemia: Secondary | ICD-10-CM | POA: Diagnosis not present

## 2015-11-20 DIAGNOSIS — I5030 Unspecified diastolic (congestive) heart failure: Secondary | ICD-10-CM | POA: Diagnosis not present

## 2015-11-20 DIAGNOSIS — E1140 Type 2 diabetes mellitus with diabetic neuropathy, unspecified: Secondary | ICD-10-CM | POA: Diagnosis not present

## 2016-01-16 ENCOUNTER — Encounter: Payer: Self-pay | Admitting: Internal Medicine

## 2016-01-16 ENCOUNTER — Ambulatory Visit (INDEPENDENT_AMBULATORY_CARE_PROVIDER_SITE_OTHER): Payer: Medicare Other | Admitting: Internal Medicine

## 2016-01-16 ENCOUNTER — Ambulatory Visit (INDEPENDENT_AMBULATORY_CARE_PROVIDER_SITE_OTHER)
Admission: RE | Admit: 2016-01-16 | Discharge: 2016-01-16 | Disposition: A | Discharge status: Home / Self Care | Payer: Medicare Other | Source: Ambulatory Visit | Attending: Internal Medicine | Admitting: Internal Medicine

## 2016-01-16 VITALS — BP 124/66 | HR 95 | Ht 63.0 in | Wt 134.8 lb

## 2016-01-16 DIAGNOSIS — J449 Chronic obstructive pulmonary disease, unspecified: Secondary | ICD-10-CM | POA: Diagnosis not present

## 2016-01-16 DIAGNOSIS — R05 Cough: Secondary | ICD-10-CM | POA: Diagnosis not present

## 2016-01-16 DIAGNOSIS — J9611 Chronic respiratory failure with hypoxia: Secondary | ICD-10-CM | POA: Diagnosis not present

## 2016-01-16 DIAGNOSIS — R0602 Shortness of breath: Secondary | ICD-10-CM | POA: Diagnosis not present

## 2016-01-16 MED ORDER — TIOTROPIUM BROMIDE MONOHYDRATE 2.5 MCG/ACT IN AERS: 2.0000 | INHALATION_SPRAY | Freq: Every day | RESPIRATORY_TRACT | 0 refills | Status: DC

## 2016-01-16 MED ORDER — CEFUROXIME AXETIL 500 MG PO TABS: 500.0000 mg | ORAL_TABLET | Freq: Two times a day (BID) | ORAL | 0 refills | Status: DC

## 2016-01-16 MED ORDER — TIOTROPIUM BROMIDE MONOHYDRATE 2.5 MCG/ACT IN AERS: INHALATION_SPRAY | RESPIRATORY_TRACT | 11 refills | Status: DC

## 2016-01-16 NOTE — Progress Notes (Signed)
Subjective:    Patient ID: Samantha Wong, female    DOB: 04/05/1946   MRN: [id number]    Brief patient profile:  70 yowf quit smoking 2015 prev eval by Dr Delford FieldWright for copd final note 01/15/16   COPD (chronic obstructive pulmonary disease)Gold D Chronic obstructive lung disease gold stage D Mild acute exacerbation at this time Plan Daliresp refilled Take cefuroxime 500mg  twice daily for 5 days No other changes     01/16/2016 transition of care  f/u ov/Zavior Thomason re: copd ? Gold iv brov/bud/daliresp/spriva dpi/pred dep xone year Chief Complaint  Patient presents with  . Follow-up    former PW pt being treated for COPD- Pt has good and bad days- currently SOB, prod cough with green mucus.    on good days MMRC2 = can't walk a nl pace on a flat grade s sob but does fine slow and flat eg super stores ok/ no mall/ 02 4lpm and no need for saba  Flares every few months requires pred/abx  Present flare x one week with increase saba but very poor hfa   No obvious day to day or daytime variability or assoc cp or chest tightness, subjective wheeze or overt sinus or hb symptoms. No unusual exp hx or h/o childhood pna/ asthma or knowledge of premature birth.  Sleeping ok without nocturnal  or early am exacerbation  of respiratory  c/o's or need for noct saba. Also denies any obvious fluctuation of symptoms with weather or environmental changes or other aggravating or alleviating factors except as outlined above   Current Medications, Allergies, Complete Past Medical History, Past Surgical History, Family History, and Social History were reviewed in [facility] electronic medical record.  ROS  The following are not active complaints unless bolded sore throat, dysphagia, dental problems, itching, sneezing,  nasal congestion or excess/ purulent secretions, ear ache,   fever, chills, sweats, unintended wt loss, classically pleuritic or exertional cp, hemoptysis,  orthopnea pnd or leg swelling, presyncope,  palpitations, abdominal pain, anorexia, nausea, vomiting, diarrhea  or change in bowel or bladder habits, change in stools or urine, dysuria,hematuria,  rash, arthralgias, visual complaints, headache, numbness, weakness or ataxia or problems with walking or coordination,  change in mood/affect or memory.                         Objective:   Physical Exam    amb wf nad   Wt Readings from Last 3 Encounters:  01/16/16 134 lb 12.8 oz (61.1 kg)  01/15/15 137 lb 3.2 oz (62.2 kg)  02/06/14 129 lb 9.6 oz (58.8 kg)    Vital signs reviewed   HEENT: nl dentition, turbinates, and oropharynx. Nl external ear canals without cough reflex   NECK :  without JVD/Nodes/TM/ nl carotid upstrokes bilaterally   LUNGS: no acc muscle use,  Barrel chest with distant bs bilaterally   CV:  RRR  no s3 or murmur or increase in P2, no edema   ABD:  soft and nontender with pos hoovers end insp  in the supine position. No bruits or organomegaly, bowel sounds nl  MS:  Nl gait/ ext warm without deformities, calf tenderness, cyanosis or clubbing No obvious joint restrictions   SKIN: warm and dry without lesions    NEURO:  alert, approp, nl sensorium with  no motor deficits     CXR PA and Lateral:   01/16/2016 :    I personally reviewed images and agree with  radiology impression as follows:   Emphysema.  No active lung disease.         Assessment & Plan:

## 2016-01-16 NOTE — Assessment & Plan Note (Signed)
PFTs 05/2013:  FeV1 23% FVC 50% FeV1/FVC 56% TLC 106% DLCO 8% 08/2009 AAT: normal  - 01/16/2016  After extensive coaching HFA effectiveness =    75% > try change spriva to respimat   Based on best days now I stronlgy doubt her FEV1 is only 23% but she certaintly is   Group D in terms of symptom/risk and laba/lama/ICS  therefore appropriate rx at this point.    My only concern is that she doesn't use hfa well and dpi lama may not be getting into the target airways so worth trying respimat format and if not effective can always go back to the spiriva dpi  In meantime for acute flare x one week rec pred/cefuroxime as per Dr Lynelle DoctorWright's prev rx  I had an extended discussion with the patient reviewing all relevant studies completed to date and  lasting 25 minutes of a 40  Minute extended transition of care office visit  Each maintenance medication was reviewed in detail including most importantly the difference between maintenance and prns and under what circumstances the prns are to be triggered using an action plan format that is not reflected in the computer generated alphabetically organized AVS.    Please see instructions for details which were reviewed in writing and the patient given a copy highlighting the part that I personally wrote and discussed at today's ov.

## 2016-01-16 NOTE — Patient Instructions (Addendum)
Change spiriva to respimat 2 puff first thing each am   Cefuroxime 500 mg twice daily   Prednisone  20 mg daily until better then resume previous dose    Please remember to go to the  x-ray department downstairs for your tests - we will call you with the results when they are available.  Please schedule a follow up visit in 3 months but call sooner if needed with full pfts

## 2016-01-20 NOTE — Assessment & Plan Note (Signed)
rx is 4lpm 24 /7 > Adequate control on present rx, reviewed > no change in rx needed

## 2016-01-21 DIAGNOSIS — R0602 Shortness of breath: Secondary | ICD-10-CM | POA: Diagnosis not present

## 2016-01-21 DIAGNOSIS — J449 Chronic obstructive pulmonary disease, unspecified: Secondary | ICD-10-CM | POA: Diagnosis not present

## 2016-01-29 DIAGNOSIS — I10 Essential (primary) hypertension: Secondary | ICD-10-CM | POA: Diagnosis not present

## 2016-01-29 DIAGNOSIS — I2510 Atherosclerotic heart disease of native coronary artery without angina pectoris: Secondary | ICD-10-CM | POA: Diagnosis not present

## 2016-01-29 DIAGNOSIS — E785 Hyperlipidemia, unspecified: Secondary | ICD-10-CM | POA: Diagnosis not present

## 2016-02-05 DIAGNOSIS — E785 Hyperlipidemia, unspecified: Secondary | ICD-10-CM | POA: Diagnosis not present

## 2016-02-05 DIAGNOSIS — Z1329 Encounter for screening for other suspected endocrine disorder: Secondary | ICD-10-CM | POA: Diagnosis not present

## 2016-02-05 DIAGNOSIS — E1151 Type 2 diabetes mellitus with diabetic peripheral angiopathy without gangrene: Secondary | ICD-10-CM | POA: Diagnosis not present

## 2016-02-05 DIAGNOSIS — I119 Hypertensive heart disease without heart failure: Secondary | ICD-10-CM | POA: Diagnosis not present

## 2016-02-05 DIAGNOSIS — E1165 Type 2 diabetes mellitus with hyperglycemia: Secondary | ICD-10-CM | POA: Diagnosis not present

## 2016-02-14 DIAGNOSIS — J449 Chronic obstructive pulmonary disease, unspecified: Secondary | ICD-10-CM | POA: Diagnosis not present

## 2016-02-14 DIAGNOSIS — E1151 Type 2 diabetes mellitus with diabetic peripheral angiopathy without gangrene: Secondary | ICD-10-CM | POA: Diagnosis not present

## 2016-02-14 DIAGNOSIS — E1165 Type 2 diabetes mellitus with hyperglycemia: Secondary | ICD-10-CM | POA: Diagnosis not present

## 2016-02-14 DIAGNOSIS — J9610 Chronic respiratory failure, unspecified whether with hypoxia or hypercapnia: Secondary | ICD-10-CM | POA: Diagnosis not present

## 2016-03-06 DIAGNOSIS — E119 Type 2 diabetes mellitus without complications: Secondary | ICD-10-CM | POA: Diagnosis not present

## 2016-03-06 DIAGNOSIS — H2703 Aphakia, bilateral: Secondary | ICD-10-CM | POA: Diagnosis not present

## 2016-04-15 DIAGNOSIS — Z0000 Encounter for general adult medical examination without abnormal findings: Secondary | ICD-10-CM | POA: Diagnosis not present

## 2016-04-15 DIAGNOSIS — Z139 Encounter for screening, unspecified: Secondary | ICD-10-CM | POA: Diagnosis not present

## 2016-04-15 DIAGNOSIS — Z1389 Encounter for screening for other disorder: Secondary | ICD-10-CM | POA: Diagnosis not present

## 2016-04-16 ENCOUNTER — Other Ambulatory Visit: Payer: Self-pay | Admitting: Internal Medicine

## 2016-04-16 DIAGNOSIS — R0600 Dyspnea, unspecified: Secondary | ICD-10-CM

## 2016-04-17 ENCOUNTER — Encounter: Payer: Self-pay | Admitting: Internal Medicine

## 2016-04-17 ENCOUNTER — Ambulatory Visit (INDEPENDENT_AMBULATORY_CARE_PROVIDER_SITE_OTHER): Payer: Medicare Other | Admitting: Internal Medicine

## 2016-04-17 VITALS — BP 120/56 | HR 99 | Ht 64.0 in | Wt 134.0 lb

## 2016-04-17 DIAGNOSIS — J449 Chronic obstructive pulmonary disease, unspecified: Secondary | ICD-10-CM | POA: Diagnosis not present

## 2016-04-17 DIAGNOSIS — Z23 Encounter for immunization: Secondary | ICD-10-CM

## 2016-04-17 DIAGNOSIS — J9611 Chronic respiratory failure with hypoxia: Secondary | ICD-10-CM | POA: Diagnosis not present

## 2016-04-17 DIAGNOSIS — R0600 Dyspnea, unspecified: Secondary | ICD-10-CM | POA: Diagnosis not present

## 2016-04-17 LAB — PULMONARY FUNCTION TEST
DL/VA % PRED: 57 %
DL/VA: 2.75 ml/min/mmHg/L
DLCO UNC % PRED: 36 %
DLCO cor % pred: 36 %
DLCO cor: 8.76 ml/min/mmHg
DLCO unc: 8.89 ml/min/mmHg
FEF 25-75 POST: 0.28 L/s
FEF 25-75 PRE: 0.15 L/s
FEF2575-%CHANGE-POST: 80 %
FEF2575-%PRED-POST: 14 %
FEF2575-%Pred-Pre: 7 %
FEV1-%CHANGE-POST: 32 %
FEV1-%PRED-PRE: 16 %
FEV1-%Pred-Post: 21 %
FEV1-PRE: 0.37 L
FEV1-Post: 0.49 L
FEV1FVC-%CHANGE-POST: 3 %
FEV1FVC-%PRED-PRE: 50 %
FEV6-%CHANGE-POST: 30 %
FEV6-%Pred-Post: 40 %
FEV6-%Pred-Pre: 31 %
FEV6-PRE: 0.91 L
FEV6-Post: 1.19 L
FEV6FVC-%Change-Post: 1 %
FEV6FVC-%PRED-PRE: 99 %
FEV6FVC-%Pred-Post: 100 %
FVC-%CHANGE-POST: 28 %
FVC-%PRED-POST: 40 %
FVC-%Pred-Pre: 31 %
FVC-Post: 1.23 L
FVC-Pre: 0.96 L
POST FEV1/FVC RATIO: 40 %
Post FEV6/FVC ratio: 97 %
Pre FEV1/FVC ratio: 39 %
Pre FEV6/FVC Ratio: 95 %

## 2016-04-17 MED ORDER — TIOTROPIUM BROMIDE MONOHYDRATE 2.5 MCG/ACT IN AERS: 2.0000 | INHALATION_SPRAY | Freq: Every day | RESPIRATORY_TRACT | 1 refills | Status: DC

## 2016-04-17 MED ORDER — TIOTROPIUM BROMIDE MONOHYDRATE 2.5 MCG/ACT IN AERS: 2.0000 | INHALATION_SPRAY | Freq: Every day | RESPIRATORY_TRACT | 0 refills | Status: DC

## 2016-04-17 MED ORDER — CEFDINIR 300 MG PO CAPS: 300.0000 mg | ORAL_CAPSULE | Freq: Two times a day (BID) | ORAL | 11 refills | Status: DC

## 2016-04-17 NOTE — Progress Notes (Signed)
Subjective:    Patient ID: Samantha Wong, female    DOB: 05/09/1946   MRN: [id number]    Brief patient profile:  70 yowf quit smoking 2015 prev eval by Dr Delford FieldWright for copd final note 01/15/15   COPD (chronic obstructive pulmonary disease)Gold D Chronic obstructive lung disease gold stage D Mild acute exacerbation at this time Plan Daliresp refilled Take cefuroxime 500mg  twice daily for 5 days No other changes     01/16/2016 transition of care  f/u ov/[name] re: copd ? Gold IV/brov/bud/daliresp/spriva dpi/pred dep x one year Chief Complaint  Patient presents with  . Follow-up    former PW pt being treated for COPD- Pt has good and bad days- currently SOB, prod cough with green mucus.    on good days MMRC2 = can't walk a nl pace on a flat grade s sob but does fine slow and flat eg super stores ok/ no mall/ 02 4lpm and no need for saba  Flares every few months requires pred/abx  Present flare x one week with increase saba but very poor hfa  rec Change spiriva to respimat 2 puff first thing each am  Cefuroxime 500 mg twice daily  Prednisone  20 mg daily until better then resume previous dose   Please remember to go to the  x-ray department downstairs for your tests - we will call you with the results when they are available. Please schedule a follow up visit in 3 months but call sooner if needed with full pfts         04/17/2016  f/u ov/[name] re:  COPD IV/ still on brovana/bud/ spiriva dpi and pred 10 mg x > one year Chief Complaint  Patient presents with  . Follow-up    PFT's done today. She has been coughing up minimal green sputum x 2 days. Her breathing is unchanged. She uses albuterol inhaler 3 x per wk on average and neb with albuterol 2 x daily.   no change doe = MMRC2   No obvious day to day or daytime variability or assoc  mucus plugs or hemoptysis or cp or chest tightness, subjective wheeze or overt sinus or hb symptoms. No unusual exp hx or h/o childhood pna/ asthma or  knowledge of premature birth.  Sleeping ok without nocturnal  or early am exacerbation  of respiratory  c/o's or need for noct saba. Also denies any obvious fluctuation of symptoms with weather or environmental changes or other aggravating or alleviating factors except as outlined above   Current Medications, Allergies, Complete Past Medical History, Past Surgical History, Family History, and Social History were reviewed in [facility] electronic medical record.  ROS  The following are not active complaints unless bolded sore throat, dysphagia, dental problems, itching, sneezing,  nasal congestion or excess/ purulent secretions, ear ache,   fever, chills, sweats, unintended wt loss, classically pleuritic or exertional cp,  orthopnea pnd or leg swelling, presyncope, palpitations, abdominal pain, anorexia, nausea, vomiting, diarrhea  or change in bowel or bladder habits, change in stools or urine, dysuria,hematuria,  rash, arthralgias, visual complaints, headache, numbness, weakness or ataxia or problems with walking or coordination,  change in mood/affect or memory.            Objective:   Physical Exam    amb wf nad   04/17/2016      134    01/16/16 134 lb 12.8 oz (61.1 kg)  01/15/15 137 lb 3.2 oz (62.2 kg)  02/06/14 129 lb  9.6 oz (58.8 kg)    Vital signs reviewed - Note on arrival 02 sats  89% on 3lpm     HEENT: nl dentition, turbinates, and oropharynx. Nl external ear canals without cough reflex   NECK :  without JVD/Nodes/TM/ nl carotid upstrokes bilaterally   LUNGS: no acc muscle use,  Barrel chest with distant bs bilaterally   CV:  RRR  no s3 or murmur or increase in P2, no edema   ABD:  soft and nontender with pos hoovers end insp  in the supine position. No bruits or organomegaly, bowel sounds nl  MS:  Nl gait/ ext warm without deformities, calf tenderness, cyanosis or clubbing No obvious joint restrictions   SKIN: warm and dry without lesions    NEURO:  alert,  approp, nl sensorium with  no motor deficits     CXR PA and Lateral:   01/16/2016 :    I personally reviewed images and agree with radiology impression as follows:   Emphysema.  No active lung disease.         Assessment & Plan:

## 2016-04-17 NOTE — Assessment & Plan Note (Signed)
Adequate control on present rx, reviewed > no change in rx needed  = 3lpm 24/7  

## 2016-04-17 NOTE — Patient Instructions (Addendum)
omnicef 300 mg twice daily x 5 days ok to take as needed for worse cough/ congestion/ green mucus   When flare double the prednisone to 20 mg daily until better then back to one daily   Change spiriva to respimat 2 puffs each am   Please schedule a follow up visit in 6 months but call sooner if needed

## 2016-04-17 NOTE — Assessment & Plan Note (Addendum)
PFTs 05/2013:  FeV1 23% FVC 50% FeV1/FVC 56% TLC 106% DLCO 8% 08/2009 AAT: normal  - 01/16/2016   change spriva to respimat > did not do  - 04/17/2016  After extensive coaching device effectiveness =   75% on respimat (ti short) > retry the spriva respmat  - PFT's  04/17/2016  FEV1 0.49 (21 % ) ratio 40  p 32 % improvement from saba p nothing prior to study with DLCO  36/36c % corrects to 57  % for alv volume     really not much change from previous eval with very severe copd and Group D in terms of symptom/risk and laba/lama/ICS  therefore appropriate rx at this point.     I had an extended discussion with the patient reviewing all relevant studies completed to date and  lasting 15 to 20 minutes of a 25 minute visit on the following ongoing concerns:   1) really need to give the respimat a try at this point as nothing else to offer  2) The goal with a chronic steroid dependent illness is always arriving at the lowest effective dose that controls the disease/symptoms and not accepting a set "formula" which is based on statistics or guidelines that don't always take into account patient  variability or the natural hx of the dz in every individual patient, which may well vary over time.  For now therefore I recommend the patient maintain  Floor of pred of 10 for now and up to 20 for flares   3) Each maintenance medication was reviewed in detail including most importantly the difference between maintenance and as needed and under what circumstances the prns are to be used.  Please see instructions for details which were reviewed in writing and the patient given a copy.

## 2016-05-14 DIAGNOSIS — G4700 Insomnia, unspecified: Secondary | ICD-10-CM | POA: Diagnosis not present

## 2016-05-14 DIAGNOSIS — Z6822 Body mass index (BMI) 22.0-22.9, adult: Secondary | ICD-10-CM | POA: Diagnosis not present

## 2016-05-14 DIAGNOSIS — I119 Hypertensive heart disease without heart failure: Secondary | ICD-10-CM | POA: Diagnosis not present

## 2016-05-14 DIAGNOSIS — E785 Hyperlipidemia, unspecified: Secondary | ICD-10-CM | POA: Diagnosis not present

## 2016-05-14 DIAGNOSIS — J449 Chronic obstructive pulmonary disease, unspecified: Secondary | ICD-10-CM | POA: Diagnosis not present

## 2016-05-14 DIAGNOSIS — E1151 Type 2 diabetes mellitus with diabetic peripheral angiopathy without gangrene: Secondary | ICD-10-CM | POA: Diagnosis not present

## 2016-05-14 DIAGNOSIS — J069 Acute upper respiratory infection, unspecified: Secondary | ICD-10-CM | POA: Diagnosis not present

## 2016-06-10 DIAGNOSIS — J441 Chronic obstructive pulmonary disease with (acute) exacerbation: Secondary | ICD-10-CM | POA: Diagnosis not present

## 2016-06-10 DIAGNOSIS — E1165 Type 2 diabetes mellitus with hyperglycemia: Secondary | ICD-10-CM | POA: Diagnosis not present

## 2016-06-10 DIAGNOSIS — E1151 Type 2 diabetes mellitus with diabetic peripheral angiopathy without gangrene: Secondary | ICD-10-CM | POA: Diagnosis not present

## 2016-06-10 DIAGNOSIS — I119 Hypertensive heart disease without heart failure: Secondary | ICD-10-CM | POA: Diagnosis not present

## 2016-06-25 DIAGNOSIS — E1151 Type 2 diabetes mellitus with diabetic peripheral angiopathy without gangrene: Secondary | ICD-10-CM | POA: Diagnosis not present

## 2016-06-25 DIAGNOSIS — E1165 Type 2 diabetes mellitus with hyperglycemia: Secondary | ICD-10-CM | POA: Diagnosis not present

## 2016-06-25 DIAGNOSIS — G8929 Other chronic pain: Secondary | ICD-10-CM | POA: Diagnosis not present

## 2016-07-20 DIAGNOSIS — E1165 Type 2 diabetes mellitus with hyperglycemia: Secondary | ICD-10-CM | POA: Diagnosis not present

## 2016-07-20 DIAGNOSIS — E1151 Type 2 diabetes mellitus with diabetic peripheral angiopathy without gangrene: Secondary | ICD-10-CM | POA: Diagnosis not present

## 2016-07-20 DIAGNOSIS — Z6824 Body mass index (BMI) 24.0-24.9, adult: Secondary | ICD-10-CM | POA: Diagnosis not present

## 2016-07-31 DIAGNOSIS — Z1231 Encounter for screening mammogram for malignant neoplasm of breast: Secondary | ICD-10-CM | POA: Diagnosis not present

## 2016-07-31 DIAGNOSIS — M8589 Other specified disorders of bone density and structure, multiple sites: Secondary | ICD-10-CM | POA: Diagnosis not present

## 2016-07-31 DIAGNOSIS — M810 Age-related osteoporosis without current pathological fracture: Secondary | ICD-10-CM | POA: Diagnosis not present

## 2016-08-11 DIAGNOSIS — J431 Panlobular emphysema: Secondary | ICD-10-CM | POA: Diagnosis not present

## 2016-08-11 DIAGNOSIS — I2510 Atherosclerotic heart disease of native coronary artery without angina pectoris: Secondary | ICD-10-CM | POA: Diagnosis not present

## 2016-08-11 DIAGNOSIS — I10 Essential (primary) hypertension: Secondary | ICD-10-CM | POA: Diagnosis not present

## 2016-08-11 DIAGNOSIS — E785 Hyperlipidemia, unspecified: Secondary | ICD-10-CM | POA: Diagnosis not present

## 2016-08-18 DIAGNOSIS — E1151 Type 2 diabetes mellitus with diabetic peripheral angiopathy without gangrene: Secondary | ICD-10-CM | POA: Diagnosis not present

## 2016-08-18 DIAGNOSIS — E1165 Type 2 diabetes mellitus with hyperglycemia: Secondary | ICD-10-CM | POA: Diagnosis not present

## 2016-08-18 DIAGNOSIS — Z6823 Body mass index (BMI) 23.0-23.9, adult: Secondary | ICD-10-CM | POA: Diagnosis not present

## 2016-09-08 DIAGNOSIS — I119 Hypertensive heart disease without heart failure: Secondary | ICD-10-CM | POA: Diagnosis not present

## 2016-09-08 DIAGNOSIS — E1151 Type 2 diabetes mellitus with diabetic peripheral angiopathy without gangrene: Secondary | ICD-10-CM | POA: Diagnosis not present

## 2016-09-08 DIAGNOSIS — Z79899 Other long term (current) drug therapy: Secondary | ICD-10-CM | POA: Diagnosis not present

## 2016-09-08 DIAGNOSIS — E785 Hyperlipidemia, unspecified: Secondary | ICD-10-CM | POA: Diagnosis not present

## 2016-09-18 DIAGNOSIS — Z79899 Other long term (current) drug therapy: Secondary | ICD-10-CM | POA: Diagnosis not present

## 2016-09-21 DIAGNOSIS — E1165 Type 2 diabetes mellitus with hyperglycemia: Secondary | ICD-10-CM | POA: Diagnosis not present

## 2016-09-21 DIAGNOSIS — E1140 Type 2 diabetes mellitus with diabetic neuropathy, unspecified: Secondary | ICD-10-CM | POA: Diagnosis not present

## 2016-09-21 DIAGNOSIS — J449 Chronic obstructive pulmonary disease, unspecified: Secondary | ICD-10-CM | POA: Diagnosis not present

## 2016-09-21 DIAGNOSIS — Z9981 Dependence on supplemental oxygen: Secondary | ICD-10-CM | POA: Diagnosis not present

## 2016-10-15 ENCOUNTER — Encounter: Payer: Self-pay | Admitting: Internal Medicine

## 2016-10-15 ENCOUNTER — Ambulatory Visit (INDEPENDENT_AMBULATORY_CARE_PROVIDER_SITE_OTHER): Payer: Medicare Other | Admitting: Internal Medicine

## 2016-10-15 VITALS — BP 150/64 | HR 87 | Ht 64.0 in | Wt 134.0 lb

## 2016-10-15 DIAGNOSIS — J449 Chronic obstructive pulmonary disease, unspecified: Secondary | ICD-10-CM | POA: Diagnosis not present

## 2016-10-15 DIAGNOSIS — J9611 Chronic respiratory failure with hypoxia: Secondary | ICD-10-CM | POA: Diagnosis not present

## 2016-10-15 DIAGNOSIS — J310 Chronic rhinitis: Secondary | ICD-10-CM | POA: Diagnosis not present

## 2016-10-15 MED ORDER — AZELASTINE-FLUTICASONE 137-50 MCG/ACT NA SUSP: 1.0000 | Freq: Two times a day (BID) | NASAL | 11 refills | Status: DC

## 2016-10-15 MED ORDER — BUDESONIDE 0.25 MG/2ML IN SUSP: 0.2500 mg | Freq: Two times a day (BID) | RESPIRATORY_TRACT | 3 refills | Status: AC

## 2016-10-15 MED ORDER — TIOTROPIUM BROMIDE MONOHYDRATE 2.5 MCG/ACT IN AERS: 2.0000 | INHALATION_SPRAY | Freq: Every day | RESPIRATORY_TRACT | 0 refills | Status: AC

## 2016-10-15 MED ORDER — AZELASTINE-FLUTICASONE 137-50 MCG/ACT NA SUSP
1.0000 | Freq: Two times a day (BID) | NASAL | 0 refills | Status: AC
Start: 2016-10-15 — End: ?

## 2016-10-15 NOTE — Patient Instructions (Signed)
After the allergy season try prednisone 5 mg 2 on even days and 1 on odd days but if you flare go to 4 prednisone per day until better then taper to floor of 2 alternating with 1   Work on inhaler technique:  relax and gently blow all the way out then take a nice smooth deep breath back in, triggering the inhaler at same time you start breathing in.  Hold for up to 5 seconds if you can. Blow out thru nose. 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  - The less you use it, the better it will work when you need it. - Ok to use the inhaler up to 2 puffs  every 4 hours if you must but call for appointment if use goes up over your usual need - Don't leave home without it !!  (think of it like the spare tire for your car)   Plan C = Crisis - only use your albuterol nebulizer if you first try Plan B and it fails to help > ok to use the nebulizer up to every 4 hours but if start needing it regularly call for immediate appointment   Please schedule a follow up visit in 3 months but call sooner if needed

## 2016-10-15 NOTE — Assessment & Plan Note (Addendum)
PFTs 05/2013:  FeV1 23% FVC 50% FeV1/FVC 56% TLC 106%   08/2009 AAT: normal  - 2016 became pred dependent  - 01/16/2016   change spriva to respimat > did not do  - 04/17/2016    retry the spriva respmat  - PFT's  04/17/2016  FEV1 0.49 (21 % ) ratio 40  p 32 % improvement from saba p nothing prior to study with DLCO  36/36c % corrects to 57  % for alv volume    - 10/15/2016  After extensive coaching HFA effectiveness =    75% from a baseline of < 25% (double trigger,  very short Ti)   Doing pretty well considering poor hfa/smi technique (see above) so probably just benefiting from neb and way over using saba at baseline, mild flare with rhinitis (see separate a/p) while on pred 5 m x 2 daily which I would like to taper once better  The goal with a chronic steroid dependent illness is always arriving at the lowest effective dose that controls the disease/symptoms and not accepting a set "formula" which is based on statistics or guidelines that don't always take into account patient  variability or the natural hx of the dz in every individual patient, which may well vary over time.  For now therefore I recommend the patient maintain  A ceiling of 20 mg and a floor of 10/5 alternating days as tolerates   I had an extended discussion with the patient reviewing all relevant studies completed to date and  lasting 15 to 20 minutes of a 25 minute visit    Each maintenance medication was reviewed in detail including most importantly the difference between maintenance and prns and under what circumstances the prns are to be triggered using an action plan format that is not reflected in the computer generated alphabetically organized AVS.    Please see AVS for specific instructions unique to this visit that I personally wrote and verbalized to the the pt in detail and then reviewed with pt  by my nurse highlighting any  changes in therapy recommended at today's visit to their plan of care.

## 2016-10-15 NOTE — Assessment & Plan Note (Signed)
Trial of dymista one bid and if improves can refill or try the generic components if can't afford it  I emphasized that nasal steroids have no immediate benefit in terms of improving symptoms.  To help them reached the target tissue, the patient should use Afrin two puffs every 12 hours applied one min before using the nasal steroids.  Afrin should be stopped after no more than 5 days.  If the symptoms worsen, Afrin can be restarted after 5 days off of therapy to prevent rebound congestion from overuse of Afrin.  I also emphasized that in no way are nasal steroids a concern in terms of "addiction".

## 2016-10-15 NOTE — Assessment & Plan Note (Signed)
Chronic rx =  3lpm 24/7  Except using 4lpm pulse with exertion   Adequate control on present rx, reviewed in detail with pt > no change in rx needed

## 2016-10-15 NOTE — Progress Notes (Signed)
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 Gold IV/brov/bud/daliresp/spriva dpi/pred dep x one year Chief Complaint  Patient presents with  . Follow-up    former PW pt being treated for COPD- Pt has good and bad days- currently SOB, prod cough with green mucus.    on good days MMRC2 = can't walk a nl pace on a flat grade s sob but does fine slow and flat eg super stores ok/ no mall/ 02 4lpm and no need for saba  Flares every few months requires pred/abx  Present flare x one week with increase saba but very poor hfa  rec Change spiriva to respimat 2 puff first thing each am  Cefuroxime 500 mg twice daily  Prednisone  20 mg daily until better then resume previous dose   Please remember to go to the  x-ray department downstairs for your tests - we will call you with the results when they are available. 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 Follow-up    PFT's done today. She has been coughing up minimal green sputum x 2 days. Her breathing is unchanged. She uses albuterol inhaler 3 x per wk on average and neb with albuterol 2 x daily.   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 Follow-up    Increased SOB and runny nose for the past wk.  She is is using her albuterol inhaler and neb both 2 x daily on average.   worse sob  for a week but before that  Was using alb neb bid not prn  Flare assoc with watery rhinitis no better with clariton, has not used nasal sprays  No obvious day to day or daytime variability or assoc excess/ purulent sputum or mucus plugs or hemoptysis or cp or chest tightness, subjective wheeze or overt   hb symptoms. No unusual exp hx or h/o childhood pna/ asthma or knowledge of premature birth.  Sleeping ok without nocturnal  or early am exacerbation  of respiratory  c/o's or need for noct saba. Also denies any obvious fluctuation of symptoms with weather or environmental changes or other aggravating or alleviating factors except as outlined above   Current Medications, Allergies, Complete Past Medical History, Past Surgical History, Family History, and Social History were reviewed in [facility] electronic medical record.  ROS  The following are not active complaints unless bolded sore throat, dysphagia, dental problems, itching, sneezing,  nasal congestion or excess/ purulent secretions, ear ache,   fever, chills, sweats, unintended wt loss, classically pleuritic or exertional cp,  orthopnea pnd or leg swelling, presyncope, palpitations, abdominal pain, anorexia, nausea, vomiting, diarrhea  or change in bowel or bladder habits, change in stools or urine, dysuria,hematuria,  rash, arthralgias, visual complaints, headache, numbness, weakness or ataxia or problems with walking or coordination,  change in mood/affect or memory.                        Objective:   Physical Exam    amb wf nad   10/15/2016       134 04/17/2016      134    01/16/16 134 lb 12.8 oz (61.1 kg)  01/15/15  137 lb 3.2 oz (62.2 kg)  02/06/14 129 lb 9.6 oz (58.8 kg)    Vital signs reviewed - Note on arrival 02 sats 90% on 3lpm  Pulse    HEENT: nl dentition, and oropharynx. Nl external ear canals without cough reflex - moderate bilateral non-specific turbinate edema     NECK :  without JVD/Nodes/TM/ nl carotid upstrokes bilaterally   LUNGS: no acc muscle use,  Barrel chest  With very distant bs, trace wheeze bilaterally / prolonged exp   CV:  RRR  no s3 or murmur or increase in P2, no edema   ABD:  soft and nontender with pos hoovers end insp  in the supine position. No bruits or organomegaly, bowel sounds nl  MS:  Nl gait/ ext warm without deformities, calf tenderness, cyanosis or clubbing No obvious joint restrictions   SKIN: warm and dry without lesions    NEURO:  alert, approp, nl sensorium with  no motor deficits     CXR PA and Lateral:   01/16/2016 :    I personally reviewed images and agree with radiology impression as follows:   Emphysema.  No active lung disease.         Assessment & Plan:

## 2016-12-15 DIAGNOSIS — E1151 Type 2 diabetes mellitus with diabetic peripheral angiopathy without gangrene: Secondary | ICD-10-CM | POA: Diagnosis not present

## 2016-12-15 DIAGNOSIS — I119 Hypertensive heart disease without heart failure: Secondary | ICD-10-CM | POA: Diagnosis not present

## 2016-12-15 DIAGNOSIS — E785 Hyperlipidemia, unspecified: Secondary | ICD-10-CM | POA: Diagnosis not present

## 2016-12-15 DIAGNOSIS — Z79899 Other long term (current) drug therapy: Secondary | ICD-10-CM | POA: Diagnosis not present

## 2016-12-18 ENCOUNTER — Other Ambulatory Visit: Payer: Self-pay

## 2016-12-22 DIAGNOSIS — Z1389 Encounter for screening for other disorder: Secondary | ICD-10-CM | POA: Diagnosis not present

## 2016-12-22 DIAGNOSIS — J449 Chronic obstructive pulmonary disease, unspecified: Secondary | ICD-10-CM | POA: Diagnosis not present

## 2016-12-22 DIAGNOSIS — E1140 Type 2 diabetes mellitus with diabetic neuropathy, unspecified: Secondary | ICD-10-CM | POA: Diagnosis not present

## 2016-12-22 DIAGNOSIS — Z139 Encounter for screening, unspecified: Secondary | ICD-10-CM | POA: Diagnosis not present

## 2016-12-22 DIAGNOSIS — E1165 Type 2 diabetes mellitus with hyperglycemia: Secondary | ICD-10-CM | POA: Diagnosis not present

## 2016-12-22 DIAGNOSIS — I5030 Unspecified diastolic (congestive) heart failure: Secondary | ICD-10-CM | POA: Diagnosis not present

## 2016-12-31 DIAGNOSIS — I5030 Unspecified diastolic (congestive) heart failure: Secondary | ICD-10-CM | POA: Diagnosis not present

## 2016-12-31 DIAGNOSIS — Z9981 Dependence on supplemental oxygen: Secondary | ICD-10-CM | POA: Diagnosis not present

## 2016-12-31 DIAGNOSIS — J439 Emphysema, unspecified: Secondary | ICD-10-CM | POA: Diagnosis not present

## 2016-12-31 DIAGNOSIS — R0902 Hypoxemia: Secondary | ICD-10-CM | POA: Diagnosis not present

## 2017-01-06 DIAGNOSIS — I5030 Unspecified diastolic (congestive) heart failure: Secondary | ICD-10-CM | POA: Diagnosis not present

## 2017-01-06 DIAGNOSIS — J441 Chronic obstructive pulmonary disease with (acute) exacerbation: Secondary | ICD-10-CM | POA: Diagnosis not present

## 2017-01-06 DIAGNOSIS — Z6825 Body mass index (BMI) 25.0-25.9, adult: Secondary | ICD-10-CM | POA: Diagnosis not present

## 2017-01-15 ENCOUNTER — Ambulatory Visit (INDEPENDENT_AMBULATORY_CARE_PROVIDER_SITE_OTHER)
Admission: RE | Admit: 2017-01-15 | Discharge: 2017-01-15 | Disposition: A | Discharge status: Home / Self Care | Payer: Medicare Other | Source: Ambulatory Visit | Attending: Internal Medicine | Admitting: Internal Medicine

## 2017-01-15 ENCOUNTER — Ambulatory Visit (INDEPENDENT_AMBULATORY_CARE_PROVIDER_SITE_OTHER): Payer: Medicare Other | Admitting: Internal Medicine

## 2017-01-15 ENCOUNTER — Encounter: Payer: Self-pay | Admitting: Internal Medicine

## 2017-01-15 VITALS — BP 144/58 | HR 93 | Ht 64.0 in | Wt 143.0 lb

## 2017-01-15 DIAGNOSIS — J9611 Chronic respiratory failure with hypoxia: Secondary | ICD-10-CM

## 2017-01-15 DIAGNOSIS — J441 Chronic obstructive pulmonary disease with (acute) exacerbation: Secondary | ICD-10-CM | POA: Diagnosis not present

## 2017-01-15 DIAGNOSIS — R0602 Shortness of breath: Secondary | ICD-10-CM | POA: Diagnosis not present

## 2017-01-15 MED ORDER — AZITHROMYCIN 250 MG PO TABS: ORAL_TABLET | ORAL | 0 refills | Status: AC

## 2017-01-15 MED ORDER — PREDNISONE 5 MG PO TABS: 10.0000 mg | ORAL_TABLET | Freq: Every day | ORAL | 2 refills | Status: AC

## 2017-01-15 NOTE — Progress Notes (Signed)
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 Gold IV/brov/bud/daliresp/spriva dpi/pred dep x one year Chief Complaint  Patient presents with  . Follow-up    former PW pt being treated for COPD- Pt has good and bad days- currently SOB, prod cough with green mucus.    on good days MMRC2 = can't walk a nl pace on a flat grade s sob but does fine slow and flat eg super stores ok/ no mall/ 02 4lpm and no need for saba  Flares every few months requires pred/abx  Present flare x one week with increase saba but very poor hfa  rec Change spiriva to respimat 2 puff first thing each am  Cefuroxime 500 mg twice daily  Prednisone  20 mg daily until better then resume previous dose   Please remember to go to the  x-ray department downstairs for your tests - we will call you with the results when they are available. 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 Follow-up    PFT's done today. She has been coughing up minimal green sputum x 2 days. Her breathing is unchanged. She uses albuterol inhaler 3 x per wk on average and neb with albuterol 2 x daily.   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 Follow-up    Increased SOB and runny nose for the past wk.  She is is using her albuterol inhaler and neb both 2 x daily on average.   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 Follow-up    c/o increased sob, prod cough with yellow/green mucus, stuffy nose X2 weeks.    @ 4 weeks prior to ov   Still over using saba in multiple forms then acutely worse sob assoc with nasal congestion and excess/ purulent sputum  But still comfortable at rest p saba   No obvious day to day or daytime variability or assoc   mucus plugs or hemoptysis or cp or chest tightness, subjective wheeze or overt sinus or hb symptoms. No unusual exp hx or h/o childhood pna/ asthma or knowledge of premature birth.  Sleeping ok without nocturnal  or early am exacerbation  of respiratory  c/o's or need for noct saba. Also denies any obvious fluctuation of symptoms with weather or environmental  changes or other aggravating or alleviating factors except as outlined above   Current Medications, Allergies, Complete Past Medical History, Past Surgical History, Family History, and Social History were reviewed in [facility] record.  ROS  The following are not active complaints unless bolded sore throat, dysphagia, dental problems, itching, sneezing,  nasal congestion or excess/ purulent secretions, ear ache,   fever, chills, sweats, unintended wt loss, classically pleuritic or exertional cp,  orthopnea pnd or leg swelling, presyncope, palpitations, abdominal pain, anorexia, nausea, vomiting, diarrhea  or change in bowel or bladder habits, change in stools or urine, dysuria,hematuria,  rash, arthralgias, visual complaints, headache, numbness, weakness or ataxia or problems with walking or coordination,  change in mood/affect or memory.                       Objective:   Physical Exam    amb wf nad   01/15/2017       143  10/15/2016       134 04/17/2016      134    01/16/16 134 lb 12.8 oz (61.1 kg)  01/15/15 137 lb 3.2 oz (62.2 kg)  02/06/14 129 lb 9.6 oz (58.8 kg)    Vital signs reviewed - Note on arrival 02 sats89% on 4lpm  Pulse    HEENT: nl dentition, and oropharynx. Nl external ear canals without cough reflex - moderate bilateral non-specific turbinate edema     NECK :  without JVD/Nodes/TM/ nl carotid upstrokes bilaterally   LUNGS: no acc muscle use,  Barrel chest  With very distant bs, moderate late exp wheeze bilaterally / prolonged exp   CV:  RRR  no s3 or murmur or increase in P2, no edema   ABD:  soft and nontender with pos hoovers end insp  in the supine position. No bruits or organomegaly, bowel sounds nl  MS:  Nl gait/ ext warm without deformities, calf tenderness, cyanosis or clubbing No obvious joint restrictions   SKIN: warm and dry without lesions    NEURO:  alert, approp, nl sensorium with  no motor deficits        CXR PA  and Lateral:   01/15/2017 :    I personally reviewed images and agree with radiology impression as follows:    Hyperinflation interstitial thickening, most consistent with COPD/ chronic bronchitis.  Cardiomegaly without congestive failure.      Assessment & Plan:

## 2017-01-15 NOTE — Patient Instructions (Signed)
zpak   For cough  > mucinex or mucinex 1200 mg every 12 hours and use your flutter as much as possible   Plan A = Automatic =  Budesonide/ formoterol nebs twice daily and the spiriva each am x 2 pffs and prednisone 10 mg daily   Plan B = Backup Only use your albuterol as a rescue medication to be used if you can't catch your breath by resting or doing a relaxed purse lip breathing pattern.  - The less you use it, the better it will work when you need it. - Ok to use the inhaler up to 2 puffs  every 4 hours if you must but call for appointment if use goes up over your usual need - Don't leave home without it !!  (think of it like the spare tire for your car)   Plan C = Crisis - only use your albuterol nebulizer if you first try Plan B and it fails to help > ok to use the nebulizer up to every 4 hours but if start needing it regularly call for immediate appointment   Plan D = Deltasone  = Prednisone take an extra 10 mg until better then stop   Please remember to go to the  x-ray department downstairs in the basement  for your tests - we will call you with the results when they are available.       See Tammy NP w/in 6  weeks with all your medications, even over the counter meds, separated in two separate bags, the ones you take no matter what vs the ones you stop once you feel better and take only as needed when you feel you need them.   Tammy  will generate for you a new user friendly medication calendar that will put [place] all on the same page re: your medication use.     Without this process, it simply isn't possible to assure that we are providing  your outpatient care  with  the attention to detail we feel you deserve.   If we cannot assure that you're getting that kind of care,  then we cannot manage your problem effectively from this clinic.  Once you have seen Tammy and we are sure that we're all on the same page with your medication use she will arrange follow up with me.

## 2017-01-17 NOTE — Assessment & Plan Note (Addendum)
PFTs 05/2013:  FeV1 23% FVC 50% FeV1/FVC 56% TLC 106%   08/2009 AAT: normal  - 2016 became pred dependent  - 01/16/2016   change spriva to respimat > did not do  - 04/17/2016    retry the spriva respmat  - PFT's  04/17/2016  FEV1 0.49 (21 % ) ratio 40  p 32 % improvement from saba p nothing prior to study with DLCO  36/36c % corrects to 57  % for alv volume      - 01/15/2017  After extensive coaching HFA effectiveness =    75%  (still short Ti)   Acute flare assoc with rhinitis/ bronchitis so rec zpak and double dose of pred to 20 mg until better then resume pred 10 mg daily as pred dep since 2016  And still struggling with maint vs prns/ med reconciliation concepts   The goal with a chronic steroid dependent illness is always arriving at the lowest effective dose that controls the disease/symptoms and not accepting a set "formula" which is based on statistics or guidelines that don't always take into account patient  variability or the natural hx of the dz in every individual patient, which may well vary over time.  For now therefore I recommend the patient maintain  20 mg ceiling/ 10 mg floor   I had an extended discussion with the patient reviewing all relevant studies completed to date and  lasting 15 to 20 minutes of a 25 minute visit     To keep things simple, I have asked the patient to first separate medicines that are perceived as maintenance, that is to be taken daily "no matter what", from those medicines that are taken on only on an as-needed basis and I have given the patient examples of both, and then return to see our NP to generate a  detailed  medication calendar which should be followed until the next physician sees the patient and updates it.    Each maintenance medication was reviewed in detail including most importantly the difference between maintenance and prns and under what circumstances the prns are to be triggered using an action plan format that is not reflected in the  computer generated alphabetically organized AVS.    Please see AVS for specific instructions unique to this visit that I personally wrote and verbalized to the the pt in detail and then reviewed with pt  by my nurse highlighting any  changes in therapy recommended at today's visit to their plan of care.

## 2017-01-17 NOTE — Assessment & Plan Note (Signed)
Chronic rx =  3lpm 24/7  Except using 4lpm pulse with exertion   Despite exac, Adequate control on present rx, reviewed in detail with pt > no change in rx needed

## 2017-01-27 DIAGNOSIS — Z6826 Body mass index (BMI) 26.0-26.9, adult: Secondary | ICD-10-CM | POA: Diagnosis not present

## 2017-01-27 DIAGNOSIS — J449 Chronic obstructive pulmonary disease, unspecified: Secondary | ICD-10-CM | POA: Diagnosis not present

## 2017-01-27 DIAGNOSIS — J441 Chronic obstructive pulmonary disease with (acute) exacerbation: Secondary | ICD-10-CM | POA: Diagnosis not present

## 2017-01-29 DIAGNOSIS — E669 Obesity, unspecified: Secondary | ICD-10-CM | POA: Diagnosis present

## 2017-01-29 DIAGNOSIS — I252 Old myocardial infarction: Secondary | ICD-10-CM | POA: Diagnosis not present

## 2017-01-29 DIAGNOSIS — I469 Cardiac arrest, cause unspecified: Secondary | ICD-10-CM | POA: Diagnosis not present

## 2017-01-29 DIAGNOSIS — R0600 Dyspnea, unspecified: Secondary | ICD-10-CM | POA: Diagnosis not present

## 2017-01-29 DIAGNOSIS — I110 Hypertensive heart disease with heart failure: Secondary | ICD-10-CM | POA: Diagnosis not present

## 2017-01-29 DIAGNOSIS — Z9981 Dependence on supplemental oxygen: Secondary | ICD-10-CM | POA: Diagnosis not present

## 2017-01-29 DIAGNOSIS — Z955 Presence of coronary angioplasty implant and graft: Secondary | ICD-10-CM | POA: Diagnosis not present

## 2017-01-29 DIAGNOSIS — I509 Heart failure, unspecified: Secondary | ICD-10-CM | POA: Diagnosis present

## 2017-01-29 DIAGNOSIS — I959 Hypotension, unspecified: Secondary | ICD-10-CM | POA: Diagnosis not present

## 2017-01-29 DIAGNOSIS — Z66 Do not resuscitate: Secondary | ICD-10-CM | POA: Diagnosis not present

## 2017-01-29 DIAGNOSIS — J9621 Acute and chronic respiratory failure with hypoxia: Secondary | ICD-10-CM | POA: Diagnosis present

## 2017-01-29 DIAGNOSIS — Z452 Encounter for adjustment and management of vascular access device: Secondary | ICD-10-CM | POA: Diagnosis not present

## 2017-01-29 DIAGNOSIS — Z8679 Personal history of other diseases of the circulatory system: Secondary | ICD-10-CM | POA: Diagnosis not present

## 2017-01-29 DIAGNOSIS — J441 Chronic obstructive pulmonary disease with (acute) exacerbation: Secondary | ICD-10-CM | POA: Diagnosis not present

## 2017-01-29 DIAGNOSIS — E1169 Type 2 diabetes mellitus with other specified complication: Secondary | ICD-10-CM | POA: Diagnosis not present

## 2017-01-29 DIAGNOSIS — J9610 Chronic respiratory failure, unspecified whether with hypoxia or hypercapnia: Secondary | ICD-10-CM | POA: Diagnosis not present

## 2017-01-29 DIAGNOSIS — J440 Chronic obstructive pulmonary disease with acute lower respiratory infection: Secondary | ICD-10-CM | POA: Diagnosis not present

## 2017-01-29 DIAGNOSIS — Z87891 Personal history of nicotine dependence: Secondary | ICD-10-CM | POA: Diagnosis not present

## 2017-01-29 DIAGNOSIS — J9622 Acute and chronic respiratory failure with hypercapnia: Secondary | ICD-10-CM | POA: Diagnosis not present

## 2017-01-29 DIAGNOSIS — I700 Atherosclerosis of aorta: Secondary | ICD-10-CM | POA: Diagnosis not present

## 2017-01-29 DIAGNOSIS — N179 Acute kidney failure, unspecified: Secondary | ICD-10-CM | POA: Diagnosis not present

## 2017-01-29 DIAGNOSIS — E785 Hyperlipidemia, unspecified: Secondary | ICD-10-CM | POA: Diagnosis not present

## 2017-01-29 DIAGNOSIS — R069 Unspecified abnormalities of breathing: Secondary | ICD-10-CM | POA: Diagnosis not present

## 2017-01-29 DIAGNOSIS — R0602 Shortness of breath: Secondary | ICD-10-CM | POA: Diagnosis not present

## 2017-01-29 DIAGNOSIS — J189 Pneumonia, unspecified organism: Secondary | ICD-10-CM | POA: Diagnosis not present

## 2017-01-29 DIAGNOSIS — I10 Essential (primary) hypertension: Secondary | ICD-10-CM | POA: Diagnosis not present

## 2017-02-26 ENCOUNTER — Encounter: Payer: Medicare Other | Admitting: Adult Health

## 2017-03-01 DEATH — deceased

## 2018-04-05 IMAGING — DX DG CHEST 2V
2 series · 2 of 2 positions shown · non-contrast
Comparison: Portable chest x-ray of 03/09/2014

CLINICAL DATA: Recent cough, shortness of breath, diabetes, former
smoking history, emphysema

EXAM:
CHEST  2 VIEW

[chest pa]
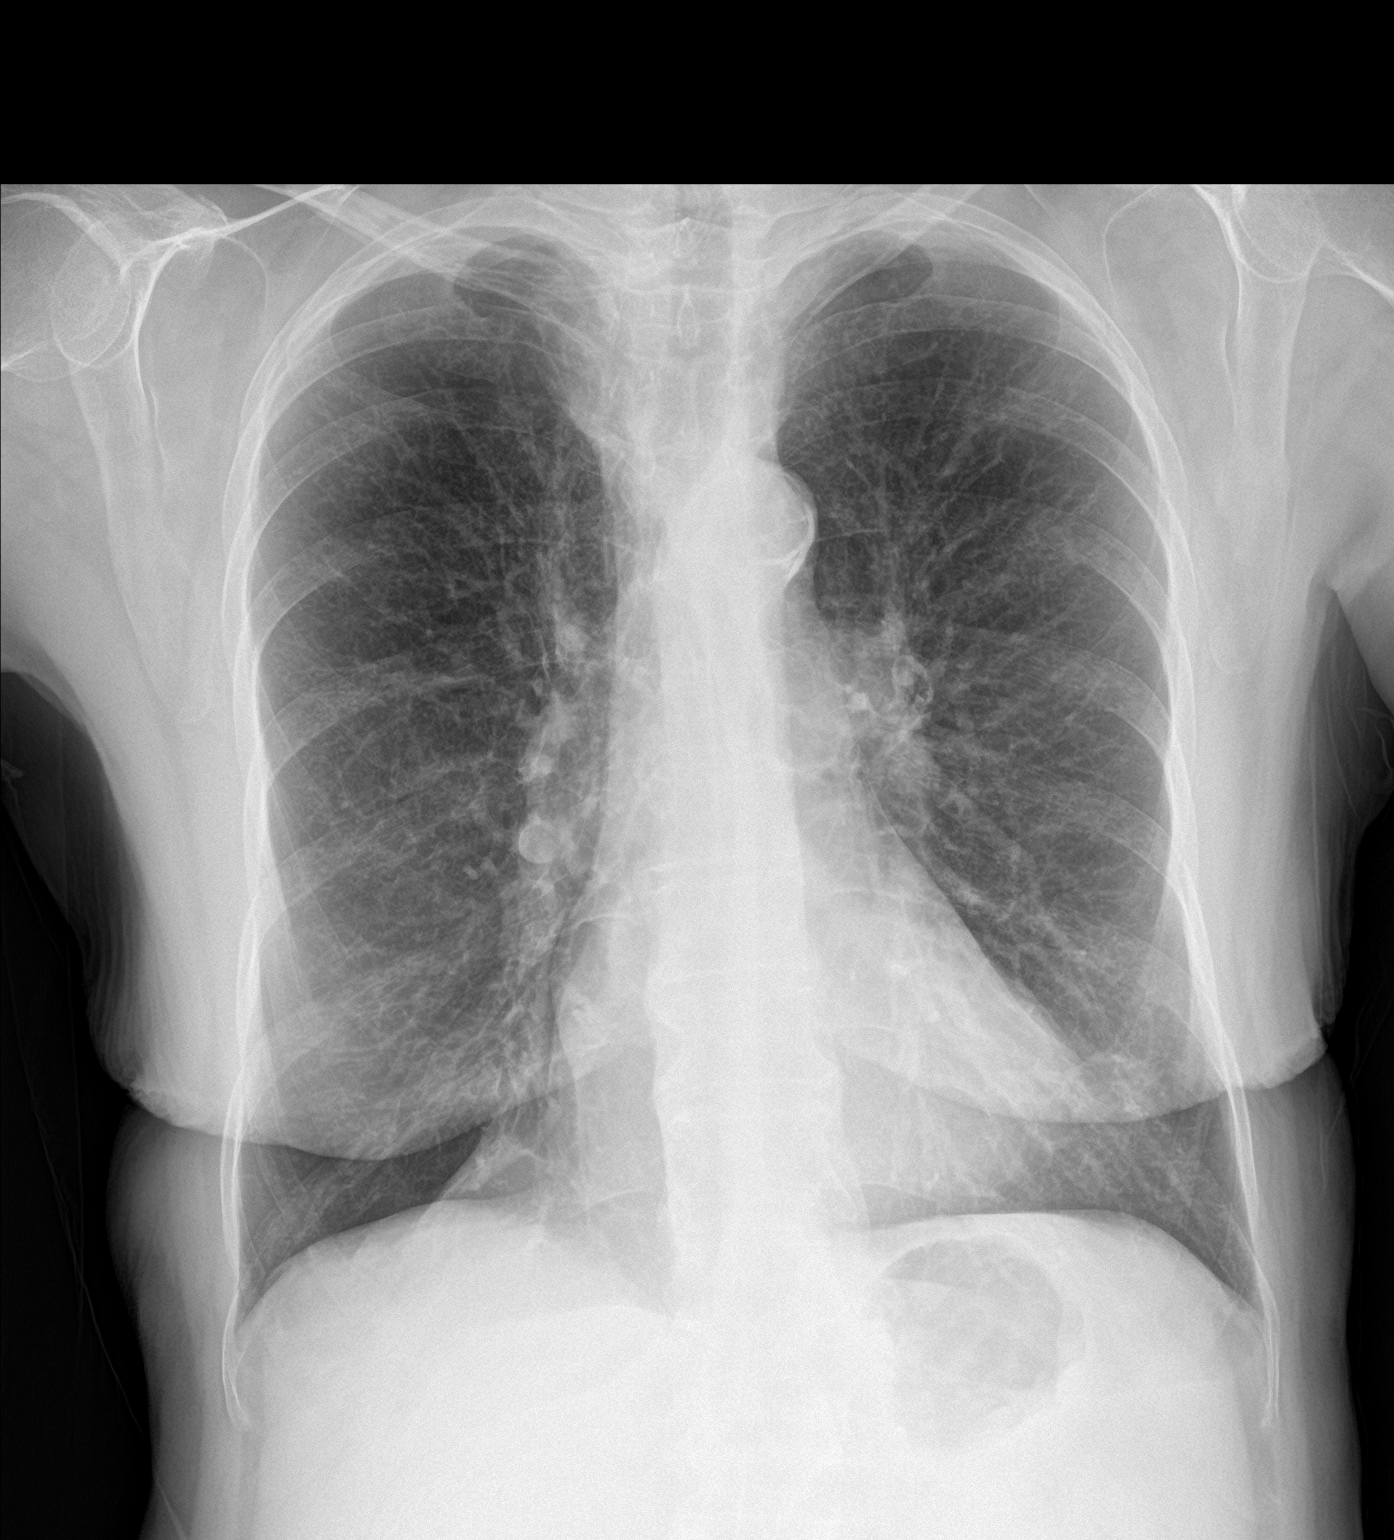

[chest lat]
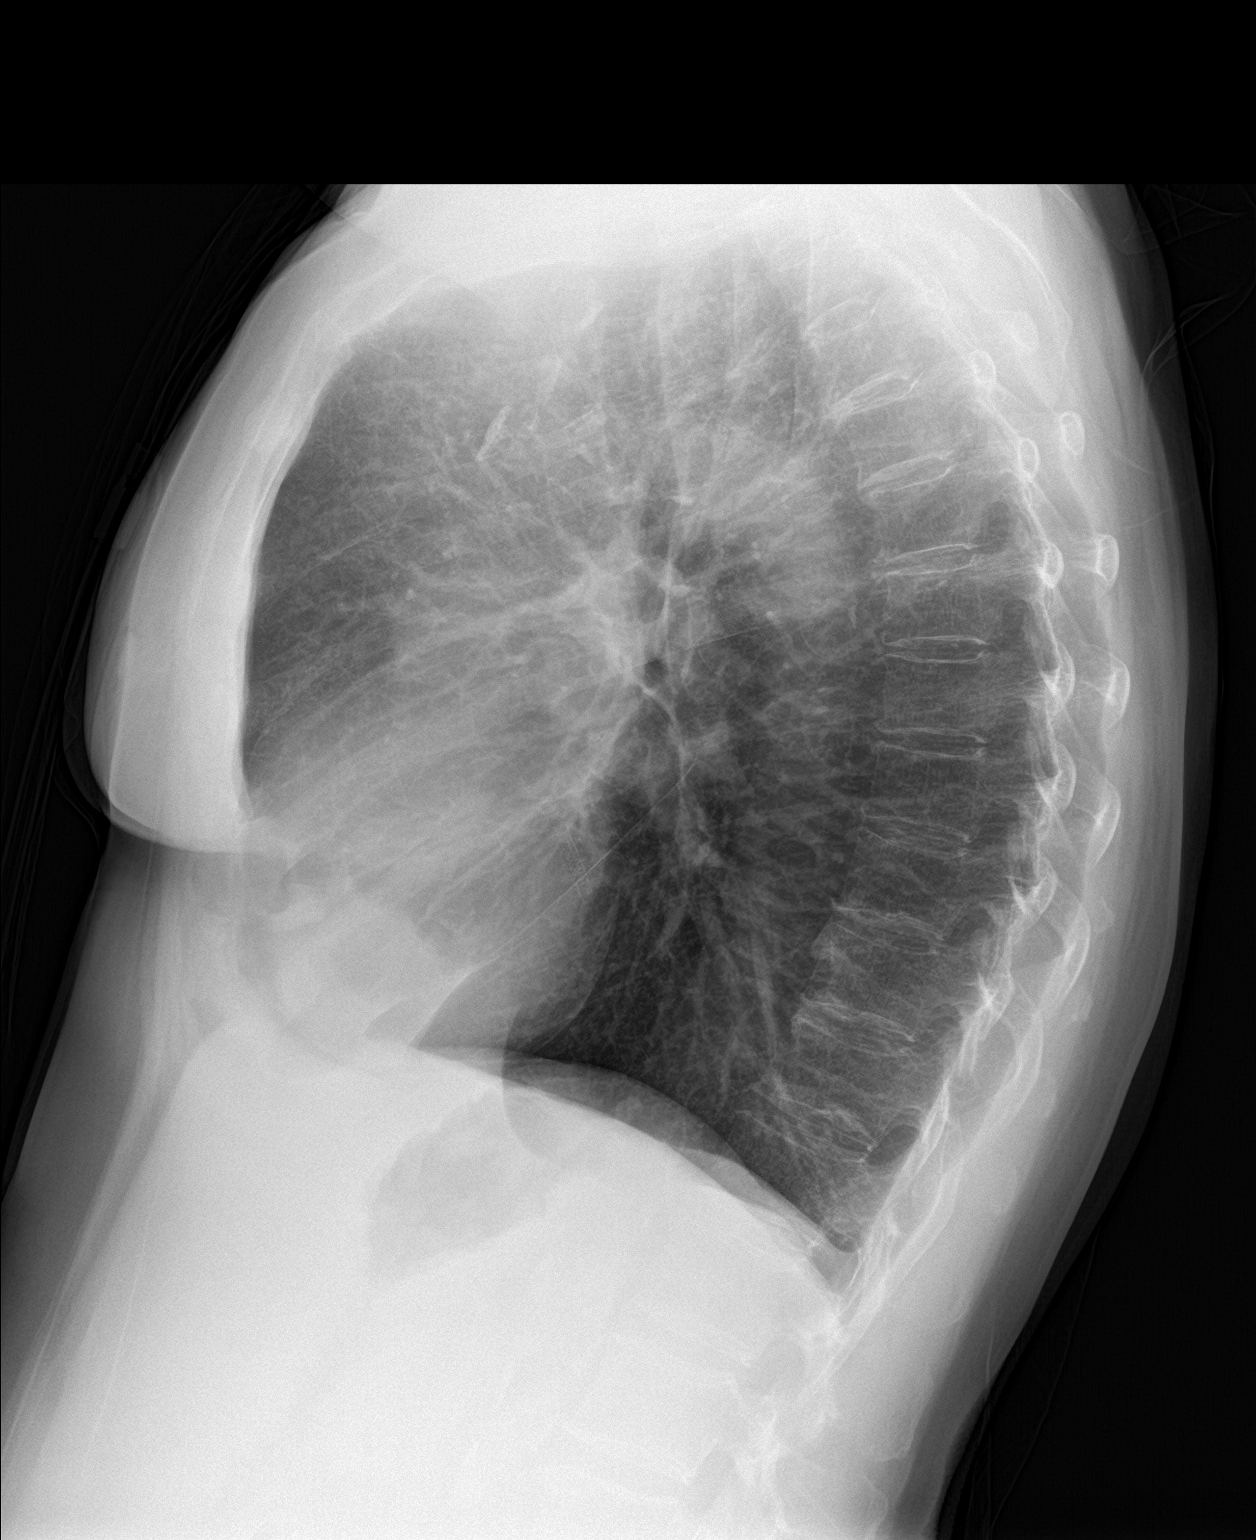

[2 of 2 positions shown; findings below may reference images not displayed]

FINDINGS: The lungs remain hyperaerated with flattened hemidiaphragms and
increased AP diameter consistent with emphysema. No pneumonia or
effusion is seen. Mediastinal and hilar contours are unremarkable.
The heart is within normal limits in size. No acute bony abnormality
is seen.
IMPRESSION: Emphysema.  No active lung disease.
